# Patient Record
Sex: Male | Born: 1970 | Race: White | Hispanic: No | Smoking: Never smoker
Health system: Southern US, Community
[De-identification: ages and names within clinical notes are randomized; demographics above are authoritative.]

## PROBLEM LIST (undated history)

## (undated) DIAGNOSIS — E785 Hyperlipidemia, unspecified: Secondary | ICD-10-CM

## (undated) DIAGNOSIS — Z9889 Other specified postprocedural states: Secondary | ICD-10-CM

## (undated) DIAGNOSIS — T8859XA Other complications of anesthesia, initial encounter: Secondary | ICD-10-CM

## (undated) DIAGNOSIS — F419 Anxiety disorder, unspecified: Secondary | ICD-10-CM

## (undated) DIAGNOSIS — I499 Cardiac arrhythmia, unspecified: Secondary | ICD-10-CM

## (undated) DIAGNOSIS — K824 Cholesterolosis of gallbladder: Secondary | ICD-10-CM

## (undated) DIAGNOSIS — I10 Essential (primary) hypertension: Secondary | ICD-10-CM

## (undated) DIAGNOSIS — K219 Gastro-esophageal reflux disease without esophagitis: Secondary | ICD-10-CM

## (undated) DIAGNOSIS — Z87442 Personal history of urinary calculi: Secondary | ICD-10-CM

## (undated) DIAGNOSIS — G43109 Migraine with aura, not intractable, without status migrainosus: Secondary | ICD-10-CM

## (undated) DIAGNOSIS — F411 Generalized anxiety disorder: Secondary | ICD-10-CM

## (undated) DIAGNOSIS — N200 Calculus of kidney: Secondary | ICD-10-CM

## (undated) DIAGNOSIS — I2510 Atherosclerotic heart disease of native coronary artery without angina pectoris: Secondary | ICD-10-CM

## (undated) HISTORY — PX: ROTATOR CUFF REPAIR: SHX139

## (undated) HISTORY — DX: Cardiac arrhythmia, unspecified: I49.9

## (undated) HISTORY — DX: Cholesterolosis of gallbladder: K82.4

## (undated) HISTORY — DX: Migraine with aura, not intractable, without status migrainosus: G43.109

## (undated) HISTORY — DX: Gastro-esophageal reflux disease without esophagitis: K21.9

## (undated) HISTORY — DX: Generalized anxiety disorder: F41.1

## (undated) HISTORY — DX: Hyperlipidemia, unspecified: E78.5

## (undated) HISTORY — DX: Essential (primary) hypertension: I10

## (undated) HISTORY — DX: Calculus of kidney: N20.0

## (undated) HISTORY — PX: OTHER SURGICAL HISTORY: SHX169

## (undated) HISTORY — PX: TONSILLECTOMY: SUR1361

## (undated) HISTORY — PX: RETINAL DETACHMENT SURGERY: SHX105

## (undated) HISTORY — PX: INGUINAL HERNIA REPAIR: SUR1180

---

## 2007-03-01 ENCOUNTER — Encounter: Admission: RE | Admit: 2007-03-01 | Discharge: 2007-03-01 | Payer: Self-pay | Admitting: *Deleted

## 2007-04-23 ENCOUNTER — Ambulatory Visit (HOSPITAL_COMMUNITY): Admission: RE | Admit: 2007-04-23 | Discharge: 2007-04-23 | Payer: Self-pay | Admitting: *Deleted

## 2008-09-22 ENCOUNTER — Ambulatory Visit: Payer: Self-pay | Admitting: Family Medicine

## 2008-09-22 DIAGNOSIS — R519 Headache, unspecified: Secondary | ICD-10-CM | POA: Insufficient documentation

## 2008-09-22 DIAGNOSIS — R51 Headache: Secondary | ICD-10-CM

## 2008-09-22 DIAGNOSIS — F411 Generalized anxiety disorder: Secondary | ICD-10-CM | POA: Insufficient documentation

## 2008-09-23 ENCOUNTER — Encounter: Payer: Self-pay | Admitting: Family Medicine

## 2008-09-23 LAB — CONVERTED CEMR LAB: TSH: 0.533 microintl units/mL (ref 0.350–4.500)

## 2008-09-29 ENCOUNTER — Ambulatory Visit: Payer: Self-pay | Admitting: Family Medicine

## 2008-09-29 DIAGNOSIS — R002 Palpitations: Secondary | ICD-10-CM

## 2010-10-18 NOTE — Op Note (Signed)
[name], Carl Kelly                 ACCOUNT NO.:  [id number]   MEDICAL RECORD NO.:  [id number]          PATIENT TYPE:  AMB   LOCATION:  DAY                          FACILITY:  [facility]   PHYSICIAN:  Alfonse Ras, MD   DATE OF BIRTH:  1971/01/17   DATE OF PROCEDURE:  04/23/2007  DATE OF DISCHARGE:                               OPERATIVE REPORT   OPERATING SURGEON:  Alfonse Ras, MD   PREOPERATIVE DIAGNOSIS:  Left groin pain, left inguinal hernia.   POSTOPERATIVE DIAGNOSIS:  Left inguinal hernia.   PROCEDURE:  Left inguinal hernia repair with mesh.   ANESTHESIA:  Laryngeal mask, general.   DESCRIPTION OF PROCEDURE:  The patient was taken to the operating room  and placed in a supine position.  After adequate general anesthesia was  induced using the laryngeal mask, the left groin was prepped and draped  in the normal sterile fashion.  Using an oblique incision over the  inguinal canal, I dissected down onto the external oblique fascia.  This  was opened along its fibers down to the external ring.  Spermatic cord  was identified, dissected, and a Penrose drain was placed around it.  There was a little bit of herniation of preperitoneal fat along the  spermatic cord, which was easily dissected off.  The vas deferens and  vessels were preserved.  There was a very small direct hernia defect  which was repaired by using interrupted 0 Surgilon sutures approximating  the transversalis fascia to the Cooper's ligament and to the shelving  edge of the inguinal ligament.  A piece of onlay Bard PTFE mesh was  placed over the repair and tacked to the pubic tubercle using running 2-  0 Prolene suture along the transversalis fascia and along the inguinal  ligament.  This was split and brought out lateral to the internal ring.  The external oblique fascia was then closed with running 3-0 Vicryl  suture.  All tissues were injected with 0.5 Marcaine.  Skin was closed  with staples.  Sterile  dressing was applied, and the patient was taken  to PACU in good condition.      Alfonse Ras, MD  Electronically Signed     KRE/MEDQ  D:  04/23/2007  T:  04/24/2007  Job:  [id number]

## 2011-03-14 LAB — BASIC METABOLIC PANEL
BUN: 7
CO2: 35 — ABNORMAL HIGH
Chloride: 100
Glucose, Bld: 118 — ABNORMAL HIGH
Potassium: 3.9

## 2011-03-14 LAB — HEMOGLOBIN AND HEMATOCRIT, BLOOD: Hemoglobin: 15.4

## 2014-03-10 ENCOUNTER — Other Ambulatory Visit (HOSPITAL_COMMUNITY): Payer: Self-pay | Admitting: Internal Medicine

## 2014-03-10 DIAGNOSIS — R1011 Right upper quadrant pain: Secondary | ICD-10-CM

## 2014-03-11 ENCOUNTER — Ambulatory Visit (HOSPITAL_COMMUNITY)
Admission: RE | Admit: 2014-03-11 | Discharge: 2014-03-11 | Disposition: A | Discharge status: Home / Self Care | Payer: 59 | Source: Ambulatory Visit | Attending: Internal Medicine | Admitting: Internal Medicine

## 2014-03-11 DIAGNOSIS — R1011 Right upper quadrant pain: Secondary | ICD-10-CM | POA: Diagnosis present

## 2014-03-13 ENCOUNTER — Encounter: Payer: Self-pay | Admitting: Physician Assistant

## 2014-03-13 ENCOUNTER — Encounter: Payer: Self-pay | Admitting: Internal Medicine

## 2014-03-14 ENCOUNTER — Encounter (HOSPITAL_COMMUNITY): Payer: Self-pay | Admitting: Emergency Medicine

## 2014-03-14 ENCOUNTER — Emergency Department (HOSPITAL_COMMUNITY)
Admission: EM | Admit: 2014-03-14 | Discharge: 2014-03-14 | Disposition: A | Discharge status: Home / Self Care | Payer: 59 | Source: Home / Self Care | Attending: Emergency Medicine | Admitting: Emergency Medicine

## 2014-03-14 DIAGNOSIS — B002 Herpesviral gingivostomatitis and pharyngotonsillitis: Secondary | ICD-10-CM

## 2014-03-14 HISTORY — DX: Anxiety disorder, unspecified: F41.9

## 2014-03-14 MED ORDER — METHYLPREDNISOLONE SODIUM SUCC 125 MG IJ SOLR
80.0000 mg | Freq: Once | INTRAMUSCULAR | Status: AC
  Administered 2014-03-14: 80 mg via INTRAMUSCULAR

## 2014-03-14 MED ORDER — LIDOCAINE VISCOUS 2 % MT SOLN: 5.0000 mL | OROMUCOSAL | Status: DC | PRN

## 2014-03-14 MED ORDER — VALACYCLOVIR HCL 1 G PO TABS: 1000.0000 mg | ORAL_TABLET | Freq: Two times a day (BID) | ORAL | Status: AC

## 2014-03-14 MED ORDER — METHYLPREDNISOLONE SODIUM SUCC 125 MG IJ SOLR
INTRAMUSCULAR | Status: AC
  Filled 2014-03-14: qty 2

## 2014-03-14 NOTE — ED Notes (Signed)
C/o  Severe sore throat, nausea, and fever since Thursday.  [facility] and tested for strep and flu.  Test were negative.  Strep has been sent for culture.     Also has sores in mouth noticed shortly after taking erythromycin.  Pt has not taken a second dose today.

## 2014-03-14 NOTE — Discharge Instructions (Signed)
You have herpetic gingivostomatitis.   Take Valtrex 1 pill twice a day for 10 days. Use the lidocaine every 4 hours as needed for mouth pain. Take tylenol and ibuprofen as needed for fevers. You should see some improvement in 24-48 hours.  Primary Herpetic Gingivostomatitis  Primary herpetic gingivostomatitis is an infection of the mouth, gums, and throat. It is a common infection in children, teenagers, and young adults. CAUSES  Primary herpetic gingivostomatitis is caused by a virus called herpes simplex type 1 (HSV). This is the same virus that causes cold sores. This virus is carried by many people. Most people get this infection early in childhood. Once infected, people carry the virus forever. It may flare up as cold sores repeatedly. The first infection of this virus may go unnoticed. When it causes symptoms of sore mouth and gums, it is called gingivostomatitis. SYMPTOMS  The symptoms of this infection can be mild or severe. Symptoms may last for 1 to 2 weeks and may include:  Small sores and blisters in the mouth, tongue, gums, throat, and on the lips.  Swelling of the gums.  Severe mouth pain.  Bleeding gums.  Irritability from pain.  Decreased appetite or refusal to eat or drink.  Drooling.  Bad breath.  High fever.  Swollen tender lymph nodes on the sides of the neck.  Headache.  General discomfort, uneasiness, or ill feeling. DIAGNOSIS  Diagnosis of gingivostomatitis is usually made by a physical exam. Sometimes the sores are tested for the HSV virus. TREATMENT  This infection goes away on its own. Sometimes, a medicine to treat the herpes virus is used to help shorten the illness. Medicated mouth rinses can help with mouth pain. HOME CARE INSTRUCTIONS  Only take over-the-counter or prescription medicines for pain, discomfort, or fever as directed by your caregiver.  Keep the mouth and teeth clean. Use gentle brushing. If brushing is too painful, wipe the  teeth with a wet washcloth. Bleeding of the gums may occur.  Infants should continue with breast milk or formula as normal.  Offer soft and cold foods to toddlers and children. Ice cream, gelatin dessert, and yogurt work well.  Offer plenty of liquids to prevent dehydration. Frozen ice pops and cool, non-citrus juices may be soothing.  Keep your child away from others, especially infants and patients on cancer medicines.  Wash your hands well after handling children that are infected.  Infected children should keep their hands away from their mouth. They should avoid rubbing their eyes, and they should wash their hands often. SEEK MEDICAL CARE IF:   Your child is refusing to drink or take fluids.  Your child's fever comes back after being gone for 1 or 2 days.  Your child's pain is severe and is not controlled with medicines.  Your child's condition is getting worse. SEEK IMMEDIATE MEDICAL CARE IF:   Your child has pain and redness in the eye.  Your child has decreased or blurred vision.  Your child has eye pain or increased sensitivity to light.  Your child has tearing or fluid draining from the eye.  Your child has signs of dehydration such as unusual fussiness, weakness, fatigue, dry mouth, no tears when crying, or not urinating at least once every 8 hours. MAKE SURE YOU:  Understand these instructions.  Will watch your child's condition.  Will get help right away if your child is not doing well or gets worse. Document Released: 08/29/2007 Document Revised: 08/14/2011 Document Reviewed: 12/12/2010 [employer] Patient Information  2015 [employer]. This information is not intended to replace advice given to you by your health care provider. Make sure you discuss any questions you have with your health care provider. ° °

## 2014-03-14 NOTE — ED Provider Notes (Addendum)
CSN: [id number]     Arrival date & time 03/14/14  1221 History   First MD Initiated Contact with Patient 03/14/14 1341     Chief Complaint  Patient presents with  . Sore Throat  . Fever   (Consider location/radiation/quality/duration/timing/severity/associated sxs/prior Treatment) HPI He is a 43 year old man here for evaluation of sore throat and fever. His symptoms started on Thursday with a sore throat. On Friday, the sore throat got worse and he developed fevers to 102, body aches, ulcers in his mouth. He was seen at an urgent care and had a negative strep and flu. He took some Tylenol this morning, which helps the fever. He is unable to keep food do to the pain in his throat. It is difficult to drink liquids. He denies any cough or shortness of breath. Denies any nausea or vomiting.  Past Medical History  Diagnosis Date  . Anxiety    Past Surgical History  Procedure Laterality Date  . Hernia repair    . Tonsillectomy    . Rotator cuff repair    . Eye surgery     History reviewed. No pertinent family history. History  Substance Use Topics  . Smoking status: Never Smoker   . Smokeless tobacco: Not on file  . Alcohol Use: Yes    Review of Systems  Constitutional: Positive for fever, appetite change and fatigue.  HENT: Positive for sore throat and trouble swallowing. Negative for congestion and rhinorrhea.   Respiratory: Negative.   Gastrointestinal: Negative.   Genitourinary: Negative for decreased urine volume.  Musculoskeletal: Positive for myalgias.  Skin: Positive for rash (ulcers in mouth).  Neurological: Negative.     Allergies  Ampicillin and Meperidine hcl  Home Medications   Prior to Admission medications   Medication Sig Start Date End Date Taking? Authorizing Provider  clonazePAM (KLONOPIN) 0.5 MG tablet Take 0.5 mg by mouth 2 (two) times daily as needed for anxiety.   Yes Historical Provider, MD  DULoxetine (CYMBALTA) 60 MG capsule Take 60 mg by mouth  daily.   Yes Historical Provider, MD  ezetimibe (ZETIA) 10 MG tablet Take 10 mg by mouth daily.   Yes Historical Provider, MD  metoprolol tartrate (LOPRESSOR) 25 MG tablet Take 25 mg by mouth 2 (two) times daily.   Yes Historical Provider, MD  omeprazole (PRILOSEC) 40 MG capsule Take 40 mg by mouth daily.   Yes Historical Provider, MD  lidocaine (XYLOCAINE) 2 % solution Use as directed 5 mLs in the mouth or throat every 4 (four) hours as needed for mouth pain. 03/14/14   Charm RingsErin J Oswald Pott, MD  valACYclovir (VALTREX) 1000 MG tablet Take 1 tablet (1,000 mg total) by mouth 2 (two) times daily. 03/14/14 03/28/14  Charm RingsErin J Arilla Hice, MD   BP 133/81  Pulse 101  Temp(Src) 99.5 F (37.5 C) (Oral)  Resp 16  SpO2 100% Physical Exam  Constitutional: He is oriented to person, place, and time. He appears well-developed and well-nourished. No distress.  HENT:  Head: Normocephalic and atraumatic.  Mouth/Throat: Posterior oropharyngeal edema and posterior oropharyngeal erythema present. No oropharyngeal exudate.  Multiple aphthous ulcers on palate, buccal mucosa, and interior lips.  Neck: Neck supple.  Cardiovascular: Normal rate, regular rhythm and normal heart sounds.   No murmur heard. Pulmonary/Chest: Effort normal and breath sounds normal. No respiratory distress. He has no wheezes. He has no rales.  Lymphadenopathy:    He has cervical adenopathy.  Neurological: He is alert and oriented to person, place, and time.  Skin: Skin is warm and dry. No rash noted.    ED Course  Procedures (including critical care time) Labs Review Labs Reviewed - No data to display  Imaging Review No results found.   MDM   1. Herpetic gingivostomatitis    History and exam consistent with primary episode HSV. Had extensive discussion with patient and his girlfriend regarding etiology and sequela. Will treat with Valtrex 1 g twice a day x10 days. Viscous lidocaine prescription provided for symptomatic care. Emphasized  importance of fluid intake. Followup if no improvement by middle of next week. Warning signs reviewed as in after visit summary.  Solumedrol 80mg  IM given.  Charm RingsErin J Valeria Boza, MD 03/14/14 1423  Charm RingsErin J Arturo Freundlich, MD 06/26/14 1300

## 2014-03-17 ENCOUNTER — Encounter (HOSPITAL_COMMUNITY): Payer: Self-pay | Admitting: Emergency Medicine

## 2014-03-17 ENCOUNTER — Emergency Department (HOSPITAL_COMMUNITY)
Admission: EM | Admit: 2014-03-17 | Discharge: 2014-03-17 | Disposition: A | Discharge status: Home / Self Care | Payer: 59 | Attending: Emergency Medicine | Admitting: Emergency Medicine

## 2014-03-17 DIAGNOSIS — F419 Anxiety disorder, unspecified: Secondary | ICD-10-CM | POA: Insufficient documentation

## 2014-03-17 DIAGNOSIS — Z79899 Other long term (current) drug therapy: Secondary | ICD-10-CM | POA: Diagnosis not present

## 2014-03-17 DIAGNOSIS — B002 Herpesviral gingivostomatitis and pharyngotonsillitis: Secondary | ICD-10-CM | POA: Diagnosis not present

## 2014-03-17 DIAGNOSIS — J029 Acute pharyngitis, unspecified: Secondary | ICD-10-CM | POA: Diagnosis present

## 2014-03-17 MED ORDER — HYDROCODONE-ACETAMINOPHEN 7.5-325 MG/15ML PO SOLN
10.0000 mL | Freq: Once | ORAL | Status: AC
  Administered 2014-03-17: 10 mL via ORAL
  Filled 2014-03-17: qty 15

## 2014-03-17 MED ORDER — HYDROCODONE-ACETAMINOPHEN 7.5-325 MG/15ML PO SOLN: 15.0000 mL | ORAL | Status: DC | PRN

## 2014-03-17 MED ORDER — MAGIC MOUTHWASH: 5.0000 mL | Freq: Four times a day (QID) | ORAL | Status: DC | PRN

## 2014-03-17 MED ORDER — ONDANSETRON 4 MG PO TBDP
4.0000 mg | ORAL_TABLET | Freq: Once | ORAL | Status: AC
  Administered 2014-03-17: 4 mg via ORAL
  Filled 2014-03-17: qty 1

## 2014-03-17 MED ORDER — MAGIC MOUTHWASH
10.0000 mL | Freq: Once | ORAL | Status: AC
  Administered 2014-03-17: 10 mL via ORAL
  Filled 2014-03-17: qty 10

## 2014-03-17 NOTE — ED Notes (Signed)
Per EMS: Patient reports he was seen at Urgent Care on Saturday and was Dx with "Herpetic gingivostomatitis", reports he has been taking the prescribed Valtrex and Vicous Lidocaine without relief. Ax4, NAD. Airway intact.

## 2014-03-17 NOTE — ED Provider Notes (Signed)
CSN: [id number]     Arrival date & time 03/17/14  0059 History   First MD Initiated Contact with Patient 03/17/14 0401     Chief Complaint  Patient presents with  . Sore Throat     (Consider location/radiation/quality/duration/timing/severity/associated sxs/prior Treatment) HPI 43 year old male presents to emergency room with complaint of oral ulcers.  Patient seen on the 10th and diagnosed with herpetic gingiva stomatitis.  He has been taking Valtrex and this is limiting but has not had significant pain relief.  He is able to handle his own discretion.  He has had no further fevers.  This is his first outbreak Past Medical History  Diagnosis Date  . Anxiety    Past Surgical History  Procedure Laterality Date  . Hernia repair    . Tonsillectomy    . Rotator cuff repair    . Eye surgery     No family history on file. History  Substance Use Topics  . Smoking status: Never Smoker   . Smokeless tobacco: Not on file  . Alcohol Use: Yes    Review of Systems  See History of Present Illness; otherwise all other systems are reviewed and negative   Allergies  Meperidine hcl and Ampicillin  Home Medications   Prior to Admission medications   Medication Sig Start Date End Date Taking? Authorizing Provider  clonazePAM (KLONOPIN) 0.5 MG tablet Take 0.5 mg by mouth 2 (two) times daily as needed for anxiety.   Yes Historical Provider, MD  DULoxetine (CYMBALTA) 60 MG capsule Take 60 mg by mouth daily.   Yes Historical Provider, MD  ezetimibe (ZETIA) 10 MG tablet Take 10 mg by mouth daily.   Yes Historical Provider, MD  lidocaine (XYLOCAINE) 2 % solution Use as directed 5 mLs in the mouth or throat every 4 (four) hours as needed for mouth pain. 03/14/14  Yes Charm RingsErin J Honig, MD  metoprolol-hydrochlorothiazide (LOPRESSOR HCT) 50-25 MG per tablet Take 1 tablet by mouth daily.   Yes Historical Provider, MD  Multiple Vitamin (MULTIVITAMIN WITH MINERALS) TABS tablet Take 1 tablet by mouth  daily.   Yes Historical Provider, MD  niacin 100 MG tablet Take 100 mg by mouth daily.   Yes Historical Provider, MD  omega-3 acid ethyl esters (LOVAZA) 1 G capsule Take 1 g by mouth daily.   Yes Historical Provider, MD  omeprazole (PRILOSEC) 40 MG capsule Take 40 mg by mouth daily.   Yes Historical Provider, MD  valACYclovir (VALTREX) 1000 MG tablet Take 1 tablet (1,000 mg total) by mouth 2 (two) times daily. 03/14/14 03/28/14 Yes Charm RingsErin J Honig, MD   BP 114/68  Pulse 65  Temp(Src) 97.8 F (36.6 C) (Oral)  Resp 14  Wt 183 lb 7 oz (83.207 kg)  SpO2 98% Physical Exam  Nursing note and vitals reviewed. Constitutional: He is oriented to person, place, and time. He appears well-developed and well-nourished.  HENT:  Head: Normocephalic and atraumatic.  Nose: Nose normal.  Patient has several shallow ulcers with yellow base, noted over the lips and posterior pharynx  Eyes: Conjunctivae and EOM are normal. Pupils are equal, round, and reactive to light.  Neck: Normal range of motion. Neck supple. No JVD present. No tracheal deviation present. No thyromegaly present.  Cardiovascular: Normal rate, regular rhythm, normal heart sounds and intact distal pulses.  Exam reveals no gallop and no friction rub.   No murmur heard. Pulmonary/Chest: Effort normal and breath sounds normal. No stridor. No respiratory distress. He has no wheezes. He  has no rales. He exhibits no tenderness.  Abdominal: Soft. Bowel sounds are normal. He exhibits no distension and no mass. There is no tenderness. There is no rebound and no guarding.  Musculoskeletal: Normal range of motion. He exhibits no edema and no tenderness.  Lymphadenopathy:    He has no cervical adenopathy.  Neurological: He is alert and oriented to person, place, and time. He displays normal reflexes. He exhibits normal muscle tone. Coordination normal.  Skin: Skin is warm and dry. No rash noted. No erythema. No pallor.  Psychiatric: He has a normal mood  and affect. His behavior is normal. Judgment and thought content normal.    ED Course  Procedures (including critical care time) Labs Review Labs Reviewed - No data to display  Imaging Review No results found.   EKG Interpretation None      MDM   Final diagnoses:  Herpetic gingivostomatitis   We'll plan to treat with Magic mouthwash and Lortab elixir.   Olivia Mackielga M Layman Gully, MD 03/17/14 [id number]

## 2014-03-17 NOTE — Discharge Instructions (Signed)
Primary Herpetic Gingivostomatitis   Primary herpetic gingivostomatitis is an infection of the mouth, gums, and throat. It is a common infection in children, teenagers, and young adults.  CAUSES   Primary herpetic gingivostomatitis is caused by a virus called herpes simplex type 1 (HSV). This is the same virus that causes cold sores. This virus is carried by many people. Most people get this infection early in childhood. Once infected, people carry the virus forever. It may flare up as cold sores repeatedly. The first infection of this virus may go unnoticed. When it causes symptoms of sore mouth and gums, it is called gingivostomatitis.  SYMPTOMS   The symptoms of this infection can be mild or severe. Symptoms may last for 1 to 2 weeks and may include:   Small sores and blisters in the mouth, tongue, gums, throat, and on the lips.   Swelling of the gums.   Severe mouth pain.   Bleeding gums.   Irritability from pain.   Decreased appetite or refusal to eat or drink.   Drooling.   Bad breath.   High fever.   Swollen tender lymph nodes on the sides of the neck.   Headache.   General discomfort, uneasiness, or ill feeling.  DIAGNOSIS   Diagnosis of gingivostomatitis is usually made by a physical exam. Sometimes the sores are tested for the HSV virus.  TREATMENT   This infection goes away on its own. Sometimes, a medicine to treat the herpes virus is used to help shorten the illness. Medicated mouth rinses can help with mouth pain.  HOME CARE INSTRUCTIONS   Only take over-the-counter or prescription medicines for pain, discomfort, or fever as directed by your caregiver.   Keep the mouth and teeth clean. Use gentle brushing. If brushing is too painful, wipe the teeth with a wet washcloth. Bleeding of the gums may occur.   Infants should continue with breast milk or formula as normal.   Offer soft and cold foods to toddlers and children. Ice cream, gelatin dessert, and yogurt work well.   Offer plenty of  liquids to prevent dehydration. Frozen ice pops and cool, non-citrus juices may be soothing.   Keep your child away from others, especially infants and patients on cancer medicines.   Wash your hands well after handling children that are infected.   Infected children should keep their hands away from their mouth. They should avoid rubbing their eyes, and they should wash their hands often.  SEEK MEDICAL CARE IF:    Your child is refusing to drink or take fluids.   Your child's fever comes back after being gone for 1 or 2 days.   Your child's pain is severe and is not controlled with medicines.   Your child's condition is getting worse.  SEEK IMMEDIATE MEDICAL CARE IF:    Your child has pain and redness in the eye.   Your child has decreased or blurred vision.   Your child has eye pain or increased sensitivity to light.   Your child has tearing or fluid draining from the eye.   Your child has signs of dehydration such as unusual fussiness, weakness, fatigue, dry mouth, no tears when crying, or not urinating at least once every 8 hours.  MAKE SURE YOU:   Understand these instructions.   Will watch your child's condition.   Will get help right away if your child is not doing well or gets worse.  Document Released: 08/29/2007 Document Revised: 08/14/2011 Document Reviewed: 12/12/2010  

## 2014-03-24 ENCOUNTER — Ambulatory Visit: Payer: 59 | Admitting: Physician Assistant

## 2014-03-26 ENCOUNTER — Encounter: Payer: Self-pay | Admitting: *Deleted

## 2014-04-02 ENCOUNTER — Ambulatory Visit: Payer: 59 | Admitting: Gastroenterology

## 2014-04-02 ENCOUNTER — Telehealth: Payer: Self-pay | Admitting: *Deleted

## 2014-04-02 NOTE — Telephone Encounter (Signed)
[facility] Sent letter

## 2014-04-23 ENCOUNTER — Encounter: Payer: Self-pay | Admitting: Gastroenterology

## 2014-04-23 ENCOUNTER — Ambulatory Visit (INDEPENDENT_AMBULATORY_CARE_PROVIDER_SITE_OTHER): Payer: 59 | Admitting: Gastroenterology

## 2014-04-23 VITALS — BP 100/60 | HR 66 | Ht 70.5 in | Wt 185.0 lb

## 2014-04-23 DIAGNOSIS — R1011 Right upper quadrant pain: Secondary | ICD-10-CM | POA: Insufficient documentation

## 2014-04-23 MED ORDER — OMEPRAZOLE 40 MG PO CPDR: 40.0000 mg | DELAYED_RELEASE_CAPSULE | Freq: Two times a day (BID) | ORAL | Status: DC

## 2014-04-23 NOTE — Progress Notes (Signed)
Reviewed and agree with management. Lindalou Soltis D. Khiana Camino, M.D., FACG  

## 2014-04-23 NOTE — Progress Notes (Signed)
04/23/2014 Carl ButteBrian T Kelly [id number] 11-12-1970   HISTORY OF PRESENT ILLNESS:  This is a 43 year old male who is new to our practice and presents to the office at the referral of his PCP, Dr. Renne CriglerPharr, for evaluation of RUQ abdominal pain.  He says that the pain has been present for a couple of years on and off but recently has become constant pain that is worsened with eating.  Says about 15 minutes after eating it feels like someone punched him in his side.  Says that it is described as a "burning ache" and radiates around to the right side of his back/right shoulder blade at times.  Sometimes gets nausea if it is very severe but no vomiting.  Says that he's had reflux for a long time and has been on omeprazole 40 mg daily for about 5 years or so, which controls those symptoms very well.  Takes some NSAID's for aches and pains a couple of times per day on a couple of days out of each week.  Ultrasound of the abdomen in October showed only a 4 mm gallbladder polyp.  Labs including CBC, CMP, TSH, and amylase/lipase were all WNL's.   Past Medical History  Diagnosis Date  . Anxiety   . Gallbladder polyp   . Hyperlipidemia   . GERD (gastroesophageal reflux disease)   . GAD (generalized anxiety disorder)   . Hypertension   . Kidney stones   . Arrhythmia     heart   Past Surgical History  Procedure Laterality Date  . Inguinal hernia repair Left   . Tonsillectomy    . Rotator cuff repair Right   . Retinal detachment surgery Bilateral   . Thumb surgery Right     reports that he has never smoked. He has never used smokeless tobacco. He reports that he drinks alcohol. He reports that he does not use illicit drugs. family history includes Diabetes in his paternal grandmother; Heart disease in his father; Stomach cancer in his paternal grandfather. There is no history of Colon cancer. Allergies  Allergen Reactions  . Meperidine Hcl Anaphylaxis  . Ampicillin Rash      Outpatient Encounter  Prescriptions as of 04/23/2014  Medication Sig  . Alum & Mag Hydroxide-Simeth (MAGIC MOUTHWASH) SOLN Take 5 mLs by mouth every 6 (six) hours as needed (mouth pain). 1:1 mix of benadryl 12.5 mg/5 ml and Alum &Mag Hydroxide-simethicone.  Give 5 ml q 6 hours prn mouth pain  . clonazePAM (KLONOPIN) 0.5 MG tablet Take 0.5 mg by mouth 2 (two) times daily as needed for anxiety.  . DULoxetine (CYMBALTA) 60 MG capsule Take 60 mg by mouth daily.  Marland Kitchen. ezetimibe (ZETIA) 10 MG tablet Take 10 mg by mouth daily.  Marland Kitchen. HYDROcodone-acetaminophen (HYCET) 7.5-325 mg/15 ml solution Take 15 mLs by mouth every 4 (four) hours as needed for moderate pain or severe pain.  Marland Kitchen. lidocaine (XYLOCAINE) 2 % solution Use as directed 5 mLs in the mouth or throat every 4 (four) hours as needed for mouth pain.  . Metoprolol Tartrate (LOPRESSOR PO) Take by mouth. 50-12.5mg   Take one tablet by mouth daily.  . Multiple Vitamin (MULTIVITAMIN WITH MINERALS) TABS tablet Take 1 tablet by mouth daily.  . niacin 100 MG tablet Take 100 mg by mouth daily.  Marland Kitchen. omega-3 acid ethyl esters (LOVAZA) 1 G capsule Take 1 g by mouth daily.  Marland Kitchen. omeprazole (PRILOSEC) 40 MG capsule Take 1 capsule (40 mg total) by mouth 2 (two)  times daily.  . [DISCONTINUED] omeprazole (PRILOSEC) 40 MG capsule Take 40 mg by mouth daily.  . [DISCONTINUED] metoprolol-hydrochlorothiazide (LOPRESSOR HCT) 50-25 MG per tablet Take 1 tablet by mouth daily.     REVIEW OF SYSTEMS  : All other systems reviewed and negative except where noted in the History of Present Illness.   PHYSICAL EXAM: BP 100/60 mmHg  Pulse 66  Ht 5' 10.5" (1.791 m)  Wt 185 lb (83.915 kg)  BMI 26.16 kg/m2 General: Well developed white male in no acute distress Head: Normocephalic and atraumatic Eyes:  Sclerae anicteric, conjunctiva pink. Ears: Normal auditory acuity Lungs: Clear throughout to auscultation Heart: Regular rate and rhythm Abdomen: Soft, non-distended.  Normal bowel sounds.  RUQ TTP along  the costal margin. Musculoskeletal: Symmetrical with no gross deformities  Skin: No lesions on visible extremities Extremities: No edema  Neurological: Alert oriented x 4, grossly non-focal Psychological:  Alert and cooperative. Normal mood and affect  ASSESSMENT AND PLAN: -RUQ abdominal pain:  Intermittent for past 2 years or so, but now more constant/frequent.  Ultrasound showed only 4 mm gallbladder polyp.  Differential includes chronic cholecystitis/dysfunctional gallbladder vs GERD/esophagitis/ulcer disease, etc. -Chronic GERD:  Symptoms well controlled on omeprazole 40 mg daily.  *Will check HIDA scan with [facility].  If negative then may need to consider EGD. *Will increase omeprazole 40 mg to BID for now.

## 2014-04-23 NOTE — Patient Instructions (Signed)
Please increase Omeprazole 40 mg to twice daily, new prescription was sent to your pharmacy   ___________________________________________________________________________________________________________________________________________________________________________________________ [facility]. Please arrive 15 minutes prior to your scheduled appointment at  7:30 AM. Make certain not to have anything to eat or drink at least 6 hours prior to your test. Should this appointment date or time not work well for you, please call radiology scheduling at [phone].  _____________________________________________________________________ hepatobiliary (HIDA) scan is an imaging procedure used to diagnose problems in the liver, gallbladder and bile ducts. In the HIDA scan, a radioactive chemical or tracer is injected into a vein in your arm. The tracer is handled by the liver like bile. Bile is a fluid produced and excreted by your liver that helps your digestive system break down fats in the foods you eat. Bile is stored in your gallbladder and the gallbladder releases the bile when you eat a meal. A special nuclear medicine scanner (gamma camera) tracks the flow of the tracer from your liver into your gallbladder and small intestine.  During your HIDA scan  You'll be asked to change into a hospital gown before your HIDA scan begins. Your health care team will position you on a table, usually on your back. The radioactive tracer is then injected into a vein in your arm.The tracer travels through your bloodstream to your liver, where it's taken up by the bile-producing cells. The radioactive tracer travels with the bile from your liver into your gallbladder and through your bile ducts to your small intestine.You may feel some pressure while the radioactive tracer is injected into your vein. As you lie on the table, a special gamma camera is  positioned over your abdomen taking pictures of the tracer as it moves through your body. The gamma camera takes pictures continually for about an hour. You'll need to keep still during the HIDA scan. This can become uncomfortable, but you may find that you can lessen the discomfort by taking deep breaths and thinking about other things. Tell your health care team if you're uncomfortable. The radiologist will watch on a computer the progress of the radioactive tracer through your body. The HIDA scan may be stopped when the radioactive tracer is seen in the gallbladder and enters your small intestine. This typically takes about an hour. In some cases extra imaging will be performed if original images aren't satisfactory, if morphine is given to help visualize the gallbladder or if the medication CCK is given to look at the contraction of the gallbladder. This test typically takes 2 hours to complete. ________________________________________________________________________

## 2014-05-08 ENCOUNTER — Ambulatory Visit (HOSPITAL_COMMUNITY)
Admission: RE | Admit: 2014-05-08 | Discharge: 2014-05-08 | Disposition: A | Discharge status: Home / Self Care | Payer: 59 | Source: Ambulatory Visit | Attending: Gastroenterology | Admitting: Gastroenterology

## 2014-05-08 DIAGNOSIS — R109 Unspecified abdominal pain: Secondary | ICD-10-CM | POA: Diagnosis not present

## 2014-05-08 DIAGNOSIS — R110 Nausea: Secondary | ICD-10-CM | POA: Insufficient documentation

## 2014-05-08 DIAGNOSIS — R1011 Right upper quadrant pain: Secondary | ICD-10-CM

## 2014-05-08 MED ORDER — TECHNETIUM TC 99M MEBROFENIN IV KIT
5.5000 | PACK | Freq: Once | INTRAVENOUS | Status: AC | PRN
  Administered 2014-05-08: 6 via INTRAVENOUS

## 2014-05-13 ENCOUNTER — Encounter: Payer: Self-pay | Admitting: *Deleted

## 2014-05-14 ENCOUNTER — Telehealth: Payer: Self-pay | Admitting: Gastroenterology

## 2014-05-14 NOTE — Telephone Encounter (Signed)
Left a message for patient to call back. 

## 2014-05-15 NOTE — Telephone Encounter (Signed)
Left a message for patient to call back. 

## 2014-05-18 NOTE — Telephone Encounter (Signed)
Left a message for patient to call back. 

## 2014-05-19 NOTE — Telephone Encounter (Signed)
Unable to reach patient. Results have been mailed to patient.

## 2015-03-28 IMAGING — NM NM HEPATO W/GB/PHARM/[PERSON_NAME]
2 series · 12 of 12 positions shown · non-contrast
Comparison: None.

CLINICAL DATA: Abdominal pain and nausea for 3-4 months

EXAM:
NUCLEAR MEDICINE HEPATOBILIARY IMAGING WITH GALLBLADDER EF
TECHNIQUE: Sequential images of the abdomen were obtained [DATE] minutes
following intravenous administration of radiopharmaceutical. After
slow intravenous infusion of 1.7 micrograms Cholecystokinin,
gallbladder ejection fraction was determined.
RADIOPHARMACEUTICALS:  5.5 Millicurie Jc-IIm Choletec

[Series 1: he hepato · 4.75mm/px · 6 of 60 frames shown (1 of 2)]
[frame 6/60]
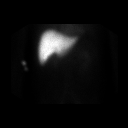
[frame 16/60]
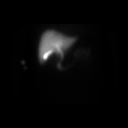
[frame 26/60]
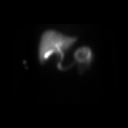
[frame 36/60]
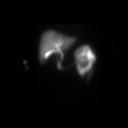
[frame 46/60]
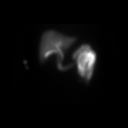
[frame 56/60]
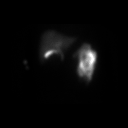

[Series 1: he hepato · 4.75mm/px · 6 of 30 frames shown (2 of 2)]
[frame 3/30]
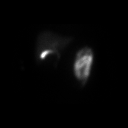
[frame 8/30]
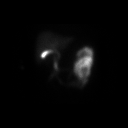
[frame 13/30]
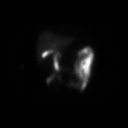
[frame 18/30]
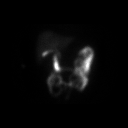
[frame 23/30]
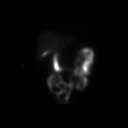
[frame 28/30]
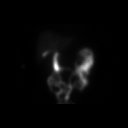

[12 of 12 positions shown; findings below may reference images not displayed]

FINDINGS: There is adequate uptake of the radiopharmaceutical by the liver.
The gallbladder is visible by 10 min and the common bile duct by 15
min. Bowel activity is evident by 20 min.. The 30 min gallbladder
ejection fraction is normal at 82%. At 30 min, normal ejection
fraction is expected to be greater than 30%.

The patient did not experience symptoms during CCK infusion.
IMPRESSION: Normal hepatobiliary scan and normal gallbladder ejection fraction.

## 2015-08-18 ENCOUNTER — Ambulatory Visit: Payer: Self-pay | Admitting: Physician Assistant

## 2015-09-24 ENCOUNTER — Ambulatory Visit: Payer: Self-pay | Admitting: Gastroenterology

## 2020-08-09 ENCOUNTER — Other Ambulatory Visit: Payer: Self-pay | Admitting: Internal Medicine

## 2020-08-09 DIAGNOSIS — G43109 Migraine with aura, not intractable, without status migrainosus: Secondary | ICD-10-CM

## 2021-12-01 ENCOUNTER — Encounter: Payer: Self-pay | Admitting: *Deleted

## 2021-12-05 ENCOUNTER — Ambulatory Visit: Payer: 59 | Admitting: Psychiatry

## 2021-12-28 ENCOUNTER — Ambulatory Visit: Payer: 59 | Admitting: Neurology

## 2021-12-28 ENCOUNTER — Encounter: Payer: Self-pay | Admitting: Neurology

## 2021-12-28 VITALS — BP 138/90 | HR 92 | Ht 71.0 in | Wt 197.4 lb

## 2021-12-28 DIAGNOSIS — G43109 Migraine with aura, not intractable, without status migrainosus: Secondary | ICD-10-CM | POA: Diagnosis not present

## 2021-12-28 DIAGNOSIS — G43709 Chronic migraine without aura, not intractable, without status migrainosus: Secondary | ICD-10-CM

## 2021-12-28 MED ORDER — RIZATRIPTAN BENZOATE 10 MG PO TBDP: 10.0000 mg | ORAL_TABLET | ORAL | 11 refills | Status: DC | PRN

## 2021-12-28 MED ORDER — EMGALITY 120 MG/ML ~~LOC~~ SOAJ: 120.0000 mg | SUBCUTANEOUS | 11 refills | Status: DC

## 2021-12-28 NOTE — Patient Instructions (Signed)
- Start Emgality prevention once monthly - did great on it, will try to get approved - stop sumatriptan - Acutely: Rizatriptan and Ondansetron. Take RIGHT at onset, repeat in 2 hours if needed. Max twice a day.  Rizatriptan Disintegrating Tablets What is this medication? RIZATRIPTAN (rye za TRIP tan) treats migraines. It works by blocking pain signals and narrowing blood vessels in the brain. It belongs to a group of medications called triptans. It is not used to prevent migraines. This medicine may be used for other purposes; ask your health care provider or pharmacist if you have questions. COMMON BRAND NAME(S): Maxalt-MLT What should I tell my care team before I take this medication? They need to know if you have any of these conditions: Cigarette smoker Circulation problems in fingers and toes Diabetes Heart disease High blood pressure High cholesterol History of irregular heartbeat History of stroke Kidney disease Liver disease Stomach or intestine problems An unusual or allergic reaction to rizatriptan, other medications, foods, dyes, or preservatives Pregnant or trying to get pregnant Breast-feeding How should I use this medication? Take this medication by mouth. Follow the directions on the prescription label. Leave the tablet in the sealed blister pack until you are ready to take it. With dry hands, open the blister and gently remove the tablet. If the tablet breaks or crumbles, throw it away and take a new tablet out of the blister pack. Place the tablet in the mouth and allow it to dissolve, and then swallow. Do not cut, crush, or chew this medication. You do not need water to take this medication. Do not take it more often than directed. Talk to your care team regarding the use of this medication in children. While this medication may be prescribed for children as young as 6 years for selected conditions, precautions do apply. Overdosage: If you think you have taken too much  of this medicine contact a poison control center or emergency room at once. NOTE: This medicine is only for you. Do not share this medicine with others. What if I miss a dose? This does not apply. This medication is not for regular use. What may interact with this medication? Do not take this medication with any of the following medications: Certain medications for migraine headache like almotriptan, eletriptan, frovatriptan, naratriptan, rizatriptan, sumatriptan, zolmitriptan Ergot alkaloids like dihydroergotamine, ergonovine, ergotamine, methylergonovine MAOIs like Carbex, Eldepryl, Marplan, Nardil, and Parnate This medication may also interact with the following medications: Certain medications for depression, anxiety, or psychotic disorders Propranolol This list may not describe all possible interactions. Give your health care provider a list of all the medicines, herbs, non-prescription drugs, or dietary supplements you use. Also tell them if you smoke, drink alcohol, or use illegal drugs. Some items may interact with your medicine. What should I watch for while using this medication? Visit your care team for regular checks on your progress. Tell your care team if your symptoms do not start to get better or if they get worse. You may get drowsy or dizzy. Do not drive, use machinery, or do anything that needs mental alertness until you know how this medication affects you. Do not stand up or sit up quickly, especially if you are an older patient. This reduces the risk of dizzy or fainting spells. Alcohol may interfere with the effect of this medication. Your mouth may get dry. Chewing sugarless gum or sucking hard candy and drinking plenty of water may help. Contact your care team if the problem does not  go away or is severe. If you take migraine medications for 10 or more days a month, your migraines may get worse. Keep a diary of headache days and medication use. Contact your care team if your  migraine attacks occur more frequently. What side effects may I notice from receiving this medication? Side effects that you should report to your care team as soon as possible: Allergic reactions--skin rash, itching, hives, swelling of the face, lips, tongue, or throat Burning, pain, tingling, or color changes in the legs or feet Heart attack--pain or tightness in the chest, shoulders, arms, or jaw, nausea, shortness of breath, cold or clammy skin, feeling faint or lightheaded Heart rhythm changes--fast or irregular heartbeat, dizziness, feeling faint or lightheaded, chest pain, trouble breathing Increase in blood pressure Irritability, confusion, fast or irregular heartbeat, muscle stiffness, twitching muscles, sweating, high fever, seizure, chills, vomiting, diarrhea, which may be signs of serotonin syndrome Raynaud's--cool, numb, or painful fingers or toes that may change color from pale, to blue, to red Seizures Stroke--sudden numbness or weakness of the face, arm, or leg, trouble speaking, confusion, trouble walking, loss of balance or coordination, dizziness, severe headache, change in vision Sudden or severe stomach pain, nausea, vomiting, fever, or bloody diarrhea Vision loss Side effects that usually do not require medical attention (report to your care team if they continue or are bothersome): Dizziness General discomfort or fatigue This list may not describe all possible side effects. Call your doctor for medical advice about side effects. You may report side effects to FDA at 1-800-FDA-1088. Where should I keep my medication? Keep out of the reach of children and pets. Store at room temperature between 15 and 30 degrees C (59 and 86 degrees F). Protect from light and moisture. Throw away any unused medication after the expiration date. NOTE: This sheet is a summary. It may not cover all possible information. If you have questions about this medicine, talk to your doctor, pharmacist,  or health care provider.  2023 Elsevier/Gold Standard (2020-06-30 00:00:00) Galcanezumab Injection What is this medication? GALCANEZUMAB (gal ka NEZ ue mab) prevents migraines. It works by blocking a substance in the body that causes migraines. It may also be used to treat cluster headaches. It is a monoclonal antibody. This medicine may be used for other purposes; ask your health care provider or pharmacist if you have questions. COMMON BRAND NAME(S): Emgality What should I tell my care team before I take this medication? They need to know if you have any of these conditions: An unusual or allergic reaction to galcanezumab, other medications, foods, dyes, or preservatives Pregnant or trying to get pregnant Breast-feeding How should I use this medication? This medication is injected under the skin. You will be taught how to prepare and give it. Take it as directed on the prescription label. Keep taking it unless your care team tells you to stop. It is important that you put your used needles and syringes in a special sharps container. Do not put them in a trash can. If you do not have a sharps container, call your pharmacist or care team to get one. Talk to your care team about the use of this medication in children. Special care may be needed. Overdosage: If you think you have taken too much of this medicine contact a poison control center or emergency room at once. NOTE: This medicine is only for you. Do not share this medicine with others. What if I miss a dose? If you miss a dose,  take it as soon as you can. If it is almost time for your next dose, take only that dose. Do not take double or extra doses. What may interact with this medication? Interactions are not expected. This list may not describe all possible interactions. Give your health care provider a list of all the medicines, herbs, non-prescription drugs, or dietary supplements you use. Also tell them if you smoke, drink alcohol,  or use illegal drugs. Some items may interact with your medicine. What should I watch for while using this medication? Visit your care team for regular checks on your progress. Tell your care team if your symptoms do not start to get better or if they get worse. What side effects may I notice from receiving this medication? Side effects that you should report to your care team as soon as possible: Allergic reactions or angioedema--skin rash, itching or hives, swelling of the face, eyes, lips, tongue, arms, or legs, trouble swallowing or breathing Side effects that usually do not require medical attention (report to your care team if they continue or are bothersome): Pain, redness, or irritation at injection site This list may not describe all possible side effects. Call your doctor for medical advice about side effects. You may report side effects to FDA at 1-800-FDA-1088. Where should I keep my medication? Keep out of the reach of children and pets. Store in a refrigerator or at room temperature between 20 and 25 degrees C (68 and 77 degrees F). Refrigeration (preferred): Store in the refrigerator. Do not freeze. Keep in the original container until you are ready to take it. Remove the dose from the carton about 30 minutes before it is time for you to use it. If the dose is not used, it may be stored in original container at room temperature for 7 days. Get rid of any unused medication after the expiration date. Room Temperature: This medication may be stored at room temperature for up to 7 days. Keep it in the original container. Protect from light until time of use. If it is stored at room temperature, get rid of any unused medication after 7 days or after it expires, whichever is first. To get rid of medications that are no longer needed or have expired: Take the medication to a medication take-back program. Check with your pharmacy or law enforcement to find a location. If you cannot return the  medication, ask your pharmacist or care team how to get rid of this medication safely. NOTE: This sheet is a summary. It may not cover all possible information. If you have questions about this medicine, talk to your doctor, pharmacist, or health care provider.  2023 Elsevier/Gold Standard (2021-07-18 00:00:00)

## 2021-12-28 NOTE — Progress Notes (Signed)
[id number]    Provider:  Dr Lucia Gaskins Requesting Provider: Fatima Sanger, FNP Primary Care Provider:  Merri Brunette, MD  CC: Migraines  HPI:  Carl Kelly is a 51 y.o. male here as requested by Fatima Sanger, FNP for migraines.  Past medical history hypercholesterolemia, GERD, GAD, irregular heartbeat, hypertension, acute prostatitis, chronic migraines.  I reviewed Dr. Dorthula Nettles notes, migraines are worse, insurance would not cover Aimovig, it worked well but it is not covered, migraines as a teenager, went away at age 42, increasing headaches in the past, tried samples of Ubrelvy and Nurtec which helped temporarily early, had sumatriptan before and did not work either, headaches have all been similar to the ones he had a younger age, states "they are horrible", left side only, during these times become sensitive to light and sound, occasionally associated with flashes of light during the headache but not prior, can last 8 to 12 hours and become incapacitating, having headaches daily, failed Advil Goody's, Excedrin, Aleve, and sumatriptan hand, he has tried other headache medications, unsure of triggers, never drinks alcohol or smokes, admits to stress, admits to aura with flashing green lights bilaterally, has had eyes examined and are normal, severe migraines 1-2 a month and headaches daily, wants to go back to Aimov[place] or similar med because it seemed to stop the migraines completely, says his migraines are debilitating and that he misses work, migraine is 8 to 10 hours of misery admits to nausea vomiting with the migraines.  Patient is here alone, migraines have been ongoing since a teenager, they can be unilateral or bilateral, pulsating pounding throbbing, photophobia and phonophobia, he can have an aura occasionally with flashing green lights bilaterally, has had his eyes examined and are normal, they can last 8 to 12 hours at a time and can be miserable, severe, no  medication overuse, also +nausea, +vomiting, he has had them all his life as a teenager and they got better in his 71s. In the last few years they have progressively worsened, exactly like prior migraines no changes in quality, not positional or exertional, no vision changes. 16 headache days a month and >8 moderate to severe migraines a month > 1 year. No medication overuse. No other focal neurologic deficits, associated symptoms, inciting events or modifiable factors.   Reviewed notes, labs and imaging from outside physicians, which showed:  Labs August 22, 2021 include hemoglobin A1c 5.9, TSH normal 1.4, CBC unremarkable, CMP BUN 7 and creatinine 1.03 states a CT of the head was ordered but I do not see those results.  From a thorough review of records, medications tried that can be used in migraine management includes metoprolol, Cymbalta, amitriptyline/nortriptyline (sedation, sumatriptan and Maxalt, nurtec, ubrelvy, emgality, ondansetron, topiramate(side effects), aimovig contraindicted due to constipation  Review of Systems: Patient complains of symptoms per HPI as well as the following symptoms migraines. Pertinent negatives and positives per HPI. All others negative.   Social History   Socioeconomic History   Marital status: Divorced    Spouse name: Not on file   Number of children: 1   Years of education: Not on file   Highest education level: Not on file  Occupational History   Occupation: maint. tech  Tobacco Use   Smoking status: Never   Smokeless tobacco: Never  Substance and Sexual Activity   Alcohol use: Yes    Comment: rare   Drug use: No   Sexual activity: Yes  Other Topics Concern   Not on  file  Social History Narrative   Not on file   Social Determinants of Health   Financial Resource Strain: Not on file  Food Insecurity: Not on file  Transportation Needs: Not on file  Physical Activity: Not on file  Stress: Not on file  Social Connections: Not on file   Intimate Partner Violence: Not on file    Family History  Problem Relation Age of Onset   Polycythemia Mother    Migraines Mother    Heart disease Father    Diabetes Paternal Grandmother    Stomach cancer Paternal Grandfather    Colon cancer Neg Hx     Past Medical History:  Diagnosis Date   Anxiety    Arrhythmia    heart   GAD (generalized anxiety disorder)    Gallbladder polyp    GERD (gastroesophageal reflux disease)    Hyperlipidemia    Hypertension    Kidney stones    Kidney stones    Migraine with aura     Patient Active Problem List   Diagnosis Date Noted   Chronic migraine without aura without status migrainosus, not intractable 12/28/2021   RUQ abdominal pain 04/23/2014   PALPITATIONS 09/29/2008   ANXIETY 09/22/2008   HEADACHE 09/22/2008    Past Surgical History:  Procedure Laterality Date   INGUINAL HERNIA REPAIR Left    RETINAL DETACHMENT SURGERY Bilateral    ROTATOR CUFF REPAIR Right    thumb surgery Right    TONSILLECTOMY      Current Outpatient Medications  Medication Sig Dispense Refill   atorvastatin (LIPITOR) 20 MG tablet Take 1 tablet by mouth daily.     DULoxetine (CYMBALTA) 60 MG capsule Take 60 mg by mouth daily.     Galcanezumab-gnlm (EMGALITY) 120 MG/ML SOAJ Inject 120 mg into the skin every 30 (thirty) days. 1.12 mL 11   Multiple Vitamin (MULTIVITAMIN WITH MINERALS) TABS tablet Take 1 tablet by mouth daily.     omeprazole (PRILOSEC) 40 MG capsule Take 1 capsule (40 mg total) by mouth 2 (two) times daily. 60 capsule 2   rizatriptan (MAXALT-MLT) 10 MG disintegrating tablet Take 1 tablet (10 mg total) by mouth as needed for migraine. May repeat in 2 hours if needed 9 tablet 11   No current facility-administered medications for this visit.    Allergies as of 12/28/2021 - Review Complete 12/28/2021  Allergen Reaction Noted   Meperidine hcl Anaphylaxis    Ampicillin Rash     Vitals: BP 138/90   Pulse 92   Ht 5\' 11"  (1.803 m)   Wt  197 lb 6.4 oz (89.5 kg)   BMI 27.53 kg/m  Last Weight:  Wt Readings from Last 1 Encounters:  12/28/21 197 lb 6.4 oz (89.5 kg)   Last Height:   Ht Readings from Last 1 Encounters:  12/28/21 5\' 11"  (1.803 m)     Physical exam: Exam: Gen: NAD, conversant, well nourised, well groomed                     CV: RRR, no MRG. No Carotid Bruits. No peripheral edema, warm, nontender Eyes: Conjunctivae clear without exudates or hemorrhage  Neuro: Detailed Neurologic Exam  Speech:    Speech is normal; fluent and spontaneous with normal comprehension.  Cognition:    The patient is oriented to person, place, and time;     recent and remote memory intact;     language fluent;     normal attention, concentration,  fund of knowledge Cranial Nerves:    The pupils are equal, round, and reactive to light. The fundi are flat. Visual fields are full to finger confrontation. Extraocular movements are intact. Trigeminal sensation is intact and the muscles of mastication are normal. The face is symmetric. The palate elevates in the midline. Hearing intact. Voice is normal. Shoulder shrug is normal. The tongue has normal motion without fasciculations.   Coordination:    Normal   Gait:    normal.   Motor Observation:    No asymmetry, no atrophy, and no involuntary movements noted. Tone:    Normal muscle tone.    Posture:    Posture is normal. normal erect    Strength:    Strength is V/V in the upper and lower limbs.      Sensation: intact to LT     Reflex Exam:  DTR's:    Deep tendon reflexes in the upper and lower extremities are normal bilaterally.   Toes:    The toes are downgoing bilaterally.   Clonus:    Clonus is absent.    Assessment/Plan:  Patient with chronic migraines. No red flags currently for brain imaging but low threshold if not improving or new/concerning symptoms.   - Start Emgality prevention once monthly - did great on it, will try to get approved - stop  sumatriptan - Acutely: Rizatriptan and Ondansetron. Take RIGHT at onset, repeat in 2 hours if needed. Max twice a day.  From a thorough review of records, medications tried that can be used in migraine management includes metoprolol(side effects), Cymbalta, amitriptyline/nortriptyline (sedation), sumatriptan(stopped working) Maxalt, nurtec, [employer] development [employer], emgality(worked fantastic), ondansetron, topiramate(side effects), aimovig contraindicted due to constipation  Discussed: To prevent or relieve headaches, try the following: Cool Compress. Lie down and place a cool compress on your head.  Avoid headache triggers. If certain foods or odors seem to have triggered your migraines in the past, avoid them. A headache diary might help you identify triggers.  Include physical activity in your daily routine. Try a daily walk or other moderate aerobic exercise.  Manage stress. Find healthy ways to cope with the stressors, such as delegating tasks on your to-do list.  Practice relaxation techniques. Try deep breathing, yoga, massage and visualization.  Eat regularly. Eating regularly scheduled meals and maintaining a healthy diet might help prevent headaches. Also, drink plenty of fluids.  Follow a regular sleep schedule. Sleep deprivation might contribute to headaches Consider biofeedback. With this mind-body technique, you learn to control certain bodily functions -- such as muscle tension, heart rate and blood pressure -- to prevent headaches or reduce headache pain.    Proceed to emergency room if you experience new or worsening symptoms or symptoms do not resolve, if you have new neurologic symptoms or if headache is severe, or for any concerning symptom.   Provided education and documentation from American headache Society toolbox including articles on: chronic migraine medication overuse headache, chronic migraines, prevention of migraines, behavioral and other nonpharmacologic treatments for  headache.   Meds ordered this encounter  Medications   rizatriptan (MAXALT-MLT) 10 MG disintegrating tablet    Sig: Take 1 tablet (10 mg total) by mouth as needed for migraine. May repeat in 2 hours if needed    Dispense:  9 tablet    Refill:  11   Galcanezumab-gnlm (EMGALITY) 120 MG/ML SOAJ    Sig: Inject 120 mg into the skin every 30 (thirty) days.    Dispense:  1.12 mL  Refill:  11    Cc: Fatima Sanger, FNP,  Merri Brunette, MD  Naomie Dean, MD  [facility] [address] [place], [place] [address]  Phone [phone] Fax [phone]  I spent 60 minutes of face-to-face and non-face-to-face time with patient on the  1. Chronic migraine without aura without status migrainosus, not intractable   2. Migraine with aura and without status migrainosus, not intractable    diagnosis.  This included previsit chart review, lab review, study review, order entry, electronic health record documentation, patient education on the different diagnostic and therapeutic options, counseling and coordination of care, risks and benefits of management, compliance, or risk factor reduction

## 2022-01-16 ENCOUNTER — Encounter: Payer: Self-pay | Admitting: Neurology

## 2022-01-16 DIAGNOSIS — G43709 Chronic migraine without aura, not intractable, without status migrainosus: Secondary | ICD-10-CM

## 2022-01-17 MED ORDER — EMGALITY 120 MG/ML ~~LOC~~ SOAJ: 120.0000 mg | SUBCUTANEOUS | 11 refills | Status: DC

## 2022-01-17 MED ORDER — RIZATRIPTAN BENZOATE 10 MG PO TBDP: 10.0000 mg | ORAL_TABLET | ORAL | 11 refills | Status: DC | PRN

## 2022-01-25 NOTE — Telephone Encounter (Signed)
Started PA on Cover My Meds but insurance requires trials of Aimovig and Ajovy first. Patient has tried Aimovig but not Ajovy.

## 2022-05-01 ENCOUNTER — Telehealth: Payer: Self-pay | Admitting: *Deleted

## 2022-05-01 ENCOUNTER — Telehealth (INDEPENDENT_AMBULATORY_CARE_PROVIDER_SITE_OTHER): Payer: 59 | Admitting: Neurology

## 2022-05-01 DIAGNOSIS — G43709 Chronic migraine without aura, not intractable, without status migrainosus: Secondary | ICD-10-CM | POA: Diagnosis not present

## 2022-05-01 MED ORDER — NURTEC 75 MG PO TBDP: 75.0000 mg | ORAL_TABLET | Freq: Every day | ORAL | 11 refills | Status: DC | PRN

## 2022-05-01 MED ORDER — EMGALITY 120 MG/ML ~~LOC~~ SOAJ: 120.0000 mg | SUBCUTANEOUS | 11 refills | Status: DC

## 2022-05-01 NOTE — Progress Notes (Signed)
[facility]    Provider:  Dr Jaynee Eagles Requesting Provider: Deland Pretty, MD Primary Care Provider:  Deland Pretty, MD  CC: Migraines  05/01/2022: Did great on Emgality, had problems with insurance, rizatriptan helps but didn't go away. While on emgality he has 4 migraines a month but we have to get it approved and nurtec worked best. I am calling the pharmacy. Refilled script and put in copay card information, discussed nurtec and ASPN. Working on [employer] for [employer] right now. Ajovy did not work.  Patient complains of symptoms per HPI as well as the following symptoms: migrianes . Pertinent negatives and positives per HPI. All others negative  HPI:  Carl Kelly is a 51 y.o. male here as requested by Deland Pretty, MD for migraines.  Past medical history hypercholesterolemia, GERD, GAD, irregular heartbeat, hypertension, acute prostatitis, chronic migraines.  I reviewed Dr. Lambert Mody notes, migraines are worse, insurance would not cover Aimovig, it worked well but it is not covered, migraines as a teenager, went away at age 23, increasing headaches in the past, tried samples of Ubrelvy and Nurtec which helped temporarily early, had sumatriptan before and did not work either, headaches have all been similar to the ones he had a younger age, states "they are horrible", left side only, during these times become sensitive to light and sound, occasionally associated with flashes of light during the headache but not prior, can last 8 to 12 hours and become incapacitating, having headaches daily, failed Advil Goody's, Excedrin, Aleve, and sumatriptan hand, he has tried other headache medications, unsure of triggers, never drinks alcohol or smokes, admits to stress, admits to aura with flashing green lights bilaterally, has had eyes examined and are normal, severe migraines 1-2 a month and headaches daily, wants to go back to [place] or similar med because it seemed to stop the migraines  completely, says his migraines are debilitating and that he misses work, migraine is 8 to 10 hours of misery admits to nausea vomiting with the migraines.  Patient is here alone, migraines have been ongoing since a teenager, they can be unilateral or bilateral, pulsating pounding throbbing, photophobia and phonophobia, he can have an aura occasionally with flashing green lights bilaterally, has had his eyes examined and are normal, they can last 8 to 12 hours at a time and can be miserable, severe, no medication overuse, also +nausea, +vomiting, he has had them all his life as a teenager and they got better in his 40s. In the last few years they have progressively worsened, exactly like prior migraines no changes in quality, not positional or exertional, no vision changes. 16 headache days a month and >8 moderate to severe migraines a month > 1 year. No medication overuse. No other focal neurologic deficits, associated symptoms, inciting events or modifiable factors.   Reviewed notes, labs and imaging from outside physicians, which showed:  Labs August 22, 2021 include hemoglobin A1c 5.9, TSH normal 1.4, CBC unremarkable, CMP BUN 7 and creatinine 1.03 states a CT of the head was ordered but I do not see those results.  From a thorough review of records, medications tried that can be used in migraine management includes metoprolol, Cymbalta, amitriptyline/nortriptyline (sedation, sumatriptan and Maxalt, nurtec, ubrelvy, emgality, ondansetron, topiramate(side effects), aimovig contraindicted due to constipation, Ajovy did not work  Review of Systems: Patient complains of symptoms per HPI as well as the following symptoms migraines. Pertinent negatives and positives per HPI. All others negative.   Social History   Socioeconomic  History   Marital status: Divorced    Spouse name: Not on file   Number of children: 1   Years of education: Not on file   Highest education level: Not on file   Occupational History   Occupation: maint. tech  Tobacco Use   Smoking status: Never   Smokeless tobacco: Never  Substance and Sexual Activity   Alcohol use: Yes    Comment: rare   Drug use: No   Sexual activity: Yes  Other Topics Concern   Not on file  Social History Narrative   Not on file   Social Determinants of Health   Financial Resource Strain: Not on file  Food Insecurity: Not on file  Transportation Needs: Not on file  Physical Activity: Not on file  Stress: Not on file  Social Connections: Not on file  Intimate Partner Violence: Not on file    Family History  Problem Relation Age of Onset   Polycythemia Mother    Migraines Mother    Heart disease Father    Diabetes Paternal Grandmother    Stomach cancer Paternal Grandfather    Colon cancer Neg Hx     Past Medical History:  Diagnosis Date   Anxiety    Arrhythmia    heart   GAD (generalized anxiety disorder)    Gallbladder polyp    GERD (gastroesophageal reflux disease)    Hyperlipidemia    Hypertension    Kidney stones    Kidney stones    Migraine with aura     Patient Active Problem List   Diagnosis Date Noted   Chronic migraine without aura without status migrainosus, not intractable 12/28/2021   RUQ abdominal pain 04/23/2014   PALPITATIONS 09/29/2008   ANXIETY 09/22/2008   HEADACHE 09/22/2008    Past Surgical History:  Procedure Laterality Date   INGUINAL HERNIA REPAIR Left    RETINAL DETACHMENT SURGERY Bilateral    ROTATOR CUFF REPAIR Right    thumb surgery Right    TONSILLECTOMY      Current Outpatient Medications  Medication Sig Dispense Refill   Rimegepant Sulfate (NURTEC) 75 MG TBDP Take 75 mg by mouth daily as needed. For migraines. Take as close to onset of migraine as possible. One daily maximum. 16 tablet 11   atorvastatin (LIPITOR) 20 MG tablet Take 1 tablet by mouth daily.     DULoxetine (CYMBALTA) 60 MG capsule Take 60 mg by mouth daily.     Galcanezumab-gnlm  (EMGALITY) 120 MG/ML SOAJ Inject 120 mg into the skin every 30 (thirty) days. BIN [id number] PCN [id number] GRP Fcem3wb ID [id number] 1.12 mL 11   Multiple Vitamin (MULTIVITAMIN WITH MINERALS) TABS tablet Take 1 tablet by mouth daily.     omeprazole (PRILOSEC) 40 MG capsule Take 1 capsule (40 mg total) by mouth 2 (two) times daily. 60 capsule 2   rizatriptan (MAXALT-MLT) 10 MG disintegrating tablet Take 1 tablet (10 mg total) by mouth as needed for migraine. May repeat in 2 hours if needed 9 tablet 11   No current facility-administered medications for this visit.    Allergies as of 05/01/2022 - Review Complete 12/28/2021  Allergen Reaction Noted   Meperidine hcl Anaphylaxis    Ampicillin Rash     Vitals: There were no vitals taken for this visit. Last Weight:  Wt Readings from Last 1 Encounters:  12/28/21 197 lb 6.4 oz (89.5 kg)   Last Height:   Ht Readings from Last 1 Encounters:  12/28/21 5\' 11"  (1.803 m)  Physical exam: Exam: Gen: NAD, conversant      CV:  Could not perform over Web Video. Denies palpitations or chest pain or SOB. VS: Breathing at a normal rate. . Not febrile. Eyes: Conjunctivae clear without exudates or hemorrhage  Neuro: Detailed Neurologic Exam  Speech:    Speech is normal; fluent and spontaneous with normal comprehension.  Cognition:    The patient is oriented to person, place, and time;     recent and remote memory intact;     language fluent;     normal attention, concentration,     fund of knowledge Cranial Nerves:    The pupils are equal, round, and reactive to light. Attempted, Cannot perform fundoscopic exam. Visual fields are full to finger confrontation. Extraocular movements are intact.  The face is symmetric with normal sensation. The palate elevates in the midline. Hearing intact. Voice is normal. Shoulder shrug is normal. The tongue has normal motion without fasciculations.   Coordination:    Normal finger to nose  Gait:    Normal native  gait  Motor Observation:   no involuntary movements noted. Tone:    Appears normal  Posture:    Posture is normal. normal erect    Strength:    Strength is anti-gravity and symmetric in the upper and lower limbs.      Sensation: intact to LT       Assessment/Plan:  Patient with chronic migraines. No red flags currently for brain imaging but low threshold if not improving or new/concerning symptoms.   Did great on Emgality, had problems with insurance, rizatriptan helps but didn't go away. While on emgality he has 4 migraines a month but we have to get it approved and nurtec worked best. I am calling the pharmacy. Refilled script and put in copay card information, discussed nurtec and ASPN. Working on [employer] for [employer] right now. Ajovy did not work. Spoke to pharmacists, ran copay card, need a PA. Working on it now. Gave them the copay. When approved we will have to call back the pharmacy and the patient otherwise give him samples until we can get this fixed.   Meds ordered this encounter  Medications   Galcanezumab-gnlm (EMGALITY) 120 MG/ML SOAJ    Sig: Inject 120 mg into the skin every 30 (thirty) days. BIN [id number] PCN [id number] GRP Fcem3wb ID [id number]    Dispense:  1.12 mL    Refill:  11   Rimegepant Sulfate (NURTEC) 75 MG TBDP    Sig: Take 75 mg by mouth daily as needed. For migraines. Take as close to onset of migraine as possible. One daily maximum.    Dispense:  16 tablet    Refill:  11    4 migraines a month less than 10 total headache days a month. Failed sumatriptan and Maxalt     From a thorough review of records, medications tried that can be used in migraine management includes metoprolol, Cymbalta, amitriptyline/nortriptyline (sedation, sumatriptan and Maxalt, nurtec, ubrelvy, emgality, ondansetron, topiramate(side effects), aimovig contraindicted due to constipation, Ajovy did not work  Discussed: To prevent or relieve headaches, try the following: Cool Compress. Lie down  and place a cool compress on your head.  Avoid headache triggers. If certain foods or odors seem to have triggered your migraines in the past, avoid them. A headache diary might help you identify triggers.  Include physical activity in your daily routine. Try a daily walk or other moderate aerobic exercise.  Manage stress. Find healthy ways to  cope with the stressors, such as delegating tasks on your to-do list.  Practice relaxation techniques. Try deep breathing, yoga, massage and visualization.  Eat regularly. Eating regularly scheduled meals and maintaining a healthy diet might help prevent headaches. Also, drink plenty of fluids.  Follow a regular sleep schedule. Sleep deprivation might contribute to headaches Consider biofeedback. With this mind-body technique, you learn to control certain bodily functions -- such as muscle tension, heart rate and blood pressure -- to prevent headaches or reduce headache pain.    Proceed to emergency room if you experience new or worsening symptoms or symptoms do not resolve, if you have new neurologic symptoms or if headache is severe, or for any concerning symptom.   Provided education and documentation from American headache Society toolbox including articles on: chronic migraine medication overuse headache, chronic migraines, prevention of migraines, behavioral and other nonpharmacologic treatments for headache.   Meds ordered this encounter  Medications   Galcanezumab-gnlm (EMGALITY) 120 MG/ML SOAJ    Sig: Inject 120 mg into the skin every 30 (thirty) days. BIN [id number] PCN [id number] GRP Fcem3wb ID [id number]    Dispense:  1.12 mL    Refill:  11   Rimegepant Sulfate (NURTEC) 75 MG TBDP    Sig: Take 75 mg by mouth daily as needed. For migraines. Take as close to onset of migraine as possible. One daily maximum.    Dispense:  16 tablet    Refill:  11    4 migraines a month less than 10 total headache days a month. Failed sumatriptan and Maxalt    Cc: Deland Pretty, MD,  Deland Pretty, MD  Sarina Ill, MD  [facility] [address] [place], [place] [address]  Phone [phone] Fax [phone]  I spent 60 minutes of face-to-face and non-face-to-face time with patient on the  1. Chronic migraine without aura without status migrainosus, not intractable     diagnosis.  This included previsit chart review, lab review, study review, order entry, electronic health record documentation, patient education on the different diagnostic and therapeutic options, counseling and coordination of care, risks and benefits of management, compliance, or risk factor reduction

## 2022-05-01 NOTE — Telephone Encounter (Signed)
CMM PA for EMGALITY (Key: [id number]) Emgality 120MG /ML auto-injectors (migraine)  [employer]

## 2022-05-04 ENCOUNTER — Other Ambulatory Visit: Payer: Self-pay | Admitting: Neurology

## 2022-05-04 MED ORDER — CANDESARTAN CILEXETIL 4 MG PO TABS: 4.0000 mg | ORAL_TABLET | Freq: Every day | ORAL | 6 refills | Status: DC

## 2022-05-04 NOTE — Telephone Encounter (Signed)
I prescribed it. Tell him insurane may not approve it but if he goes to [email or web address] he can get it with a coupon very cheap thanks

## 2022-05-04 NOTE — Addendum Note (Signed)
Addended by: Guy Begin on: 05/04/2022 01:39 PM   Modules accepted: Orders

## 2022-05-15 ENCOUNTER — Telehealth: Payer: Self-pay | Admitting: Neurology

## 2022-05-15 NOTE — Telephone Encounter (Signed)
Carl Kelly is calling asking did the office received paper work for PA for nurtec. Stated it can be faxed to [phone]

## 2022-05-15 NOTE — Telephone Encounter (Addendum)
Approved today Request Reference Number: [id number]. NURTEC TAB 75MG  ODT is approved through 08/14/2022. Your patient may now fill this prescription and it will be covered.  Faxed approval letter to [phone]. Received a receipt of confirmation.

## 2022-05-15 NOTE — Telephone Encounter (Signed)
Completed Nurtec PA on Cover My Meds. Key: [id number]. Awaiting determination from [facility].

## 2022-05-17 NOTE — Telephone Encounter (Deleted)
Spoke with [facility]. They were unable to locate the order. They will rush the processing. I have faxed the referral again. Received a receipt of confirmation.     

## 2022-09-22 ENCOUNTER — Other Ambulatory Visit (HOSPITAL_BASED_OUTPATIENT_CLINIC_OR_DEPARTMENT_OTHER): Payer: Self-pay | Admitting: Internal Medicine

## 2022-09-22 DIAGNOSIS — E782 Mixed hyperlipidemia: Secondary | ICD-10-CM

## 2022-09-25 ENCOUNTER — Telehealth (HOSPITAL_BASED_OUTPATIENT_CLINIC_OR_DEPARTMENT_OTHER): Payer: Self-pay

## 2022-10-16 ENCOUNTER — Telehealth: Payer: Self-pay

## 2022-10-16 ENCOUNTER — Other Ambulatory Visit (HOSPITAL_COMMUNITY): Payer: Self-pay

## 2022-10-16 NOTE — Telephone Encounter (Signed)
Patient Advocate Encounter   Received notification from [facility] that prior authorization is required for Nurtec 75MG  dispersible tablets   Submitted: 10-16-2022 Key [id number]  Status is pending

## 2022-10-27 NOTE — Telephone Encounter (Signed)
Patient Advocate Encounter  Prior Authorization for Nurtec 75MG  dispersible tablets has been approved.    PA# [id number] Insurance [employer] Electronic Prior Authorization Form Effective dates: 10/17/2022 through 10/16/2023

## 2022-11-29 ENCOUNTER — Encounter: Payer: Self-pay | Admitting: Neurology

## 2022-11-29 DIAGNOSIS — G43709 Chronic migraine without aura, not intractable, without status migrainosus: Secondary | ICD-10-CM

## 2022-12-04 MED ORDER — EMGALITY 120 MG/ML ~~LOC~~ SOAJ: 240.0000 mg | Freq: Once | SUBCUTANEOUS | 0 refills | Status: AC

## 2022-12-04 NOTE — Addendum Note (Signed)
Addended by: Raynald Kemp A on: 12/04/2022 08:25 AM   Modules accepted: Orders

## 2022-12-04 NOTE — Addendum Note (Signed)
Addended by: Naomie Dean B on: 12/04/2022 05:55 PM   Modules accepted: Orders

## 2022-12-06 ENCOUNTER — Ambulatory Visit (HOSPITAL_BASED_OUTPATIENT_CLINIC_OR_DEPARTMENT_OTHER)
Admission: RE | Admit: 2022-12-06 | Discharge: 2022-12-06 | Disposition: A | Discharge status: Home / Self Care | Payer: Self-pay | Source: Ambulatory Visit | Attending: Internal Medicine | Admitting: Internal Medicine

## 2022-12-06 DIAGNOSIS — E782 Mixed hyperlipidemia: Secondary | ICD-10-CM | POA: Insufficient documentation

## 2022-12-11 ENCOUNTER — Telehealth: Payer: Self-pay | Admitting: *Deleted

## 2022-12-11 ENCOUNTER — Telehealth: Payer: Self-pay

## 2022-12-11 ENCOUNTER — Other Ambulatory Visit (HOSPITAL_COMMUNITY): Payer: Self-pay

## 2022-12-11 NOTE — Telephone Encounter (Signed)
Pharmacy Patient Advocate Encounter   Received notification from [facility] that prior authorization for Emgality 120MG /ML auto-injectors (migraine) is required/requested.   PA submitted to [facility] via CoverMyMeds Key or (Medicaid) confirmation # [id number] Status is pending

## 2022-12-11 NOTE — Telephone Encounter (Signed)
Can you all work on an [employer] PA? He is saying Geophysicist/field seismologist. He's been using Candesartan since November which was required by insurance in order to get [employer] approved.

## 2022-12-11 NOTE — Telephone Encounter (Signed)
A new telephone call encounter has been made for this PA Request-please see telephone note dated ...12/11/2022. 

## 2022-12-14 ENCOUNTER — Other Ambulatory Visit: Payer: Self-pay | Admitting: *Deleted

## 2022-12-14 DIAGNOSIS — G43709 Chronic migraine without aura, not intractable, without status migrainosus: Secondary | ICD-10-CM

## 2022-12-14 MED ORDER — EMGALITY 120 MG/ML ~~LOC~~ SOAJ
120.0000 mg | SUBCUTANEOUS | 4 refills | Status: DC
Start: 2022-12-14 — End: 2023-04-26

## 2022-12-15 ENCOUNTER — Other Ambulatory Visit (HOSPITAL_COMMUNITY): Payer: Self-pay

## 2022-12-15 NOTE — Telephone Encounter (Signed)
Pharmacy Patient Advocate Encounter  Received notification from [facility] that Prior Authorization for Emgality 120MG /ML auto-injectors (migraine) has been APPROVED from 12/14/2022 to 06/13/2023.Marland Kitchen  PA #/Case ID/Reference #:  [id number]  Copay is $110.00 per 30DS per [facility] test claim.

## 2022-12-19 ENCOUNTER — Telehealth: Payer: Self-pay

## 2022-12-19 ENCOUNTER — Other Ambulatory Visit (HOSPITAL_COMMUNITY): Payer: Self-pay

## 2022-12-19 NOTE — Telephone Encounter (Signed)
PA request has been Submitted. New Encounter created for follow up. For additional info see Pharmacy Prior Auth telephone encounter from 12/19/2022.

## 2022-12-19 NOTE — Telephone Encounter (Signed)
Thanks

## 2022-12-19 NOTE — Telephone Encounter (Signed)
The PA was done for the maintenance dose- I will submit urgent PA for loading dose.

## 2022-12-19 NOTE — Telephone Encounter (Signed)
Pharmacy Patient Advocate Encounter   Received notification from Physician's Office that prior authorization for Emgality 120MG /ML auto-injectors (migraine) Loading Dose is required/requested.   Insurance verification completed.   The patient is insured through [facility] .   Per test claim: PA submitted to [facility] via CoverMyMeds Key/confirmation #/EOC [id number] Status is pending

## 2022-12-19 NOTE — Telephone Encounter (Signed)
   The patient says he did not get the loading dose of the medication. Can you see if a prior authorization is needed for the loading dose of Emgality 240 mg? Thank you.

## 2022-12-20 NOTE — Telephone Encounter (Signed)
Approval received via fax for [facility] 12/11/22-06/13/23. I have faxed approval to [facility].

## 2022-12-21 ENCOUNTER — Other Ambulatory Visit: Payer: Self-pay | Admitting: Neurology

## 2023-04-23 ENCOUNTER — Other Ambulatory Visit: Payer: Self-pay | Admitting: Neurology

## 2023-04-23 DIAGNOSIS — G43709 Chronic migraine without aura, not intractable, without status migrainosus: Secondary | ICD-10-CM

## 2023-05-21 ENCOUNTER — Other Ambulatory Visit: Payer: Self-pay | Admitting: Neurology

## 2023-05-21 DIAGNOSIS — G43709 Chronic migraine without aura, not intractable, without status migrainosus: Secondary | ICD-10-CM

## 2023-06-06 ENCOUNTER — Other Ambulatory Visit: Payer: Self-pay | Admitting: Neurology

## 2023-06-18 ENCOUNTER — Other Ambulatory Visit (HOSPITAL_COMMUNITY): Payer: Self-pay

## 2023-07-23 ENCOUNTER — Telehealth: Payer: Self-pay | Admitting: Pharmacist

## 2023-07-23 NOTE — Telephone Encounter (Signed)
 Pharmacy Patient Advocate Encounter   Received notification from Patient Pharmacy that prior authorization for Emgality 120MG /ML auto-injectors (migraine) is required/requested.   Insurance verification completed.   The patient is insured through [facility] .   Per test claim: PA required; PA submitted to above mentioned insurance via CoverMyMeds Key/confirmation #/EOC [id number] Status is pending

## 2023-07-25 NOTE — Telephone Encounter (Signed)
 Pt needs an appt to discuss and document for other medications. We haven't seen him since 2023. I sent him a Clinical cytogeneticist message.

## 2023-07-25 NOTE — Telephone Encounter (Signed)
 Pharmacy Patient Advocate Encounter  Received notification from [facility] that Prior Authorization for  Emgality 120MG /ML  has been DENIED.  See denial reason below. No denial letter attached in CMM. Will attach denial letter to Media tab once received.   PA #/Case ID/Reference #: [id number]

## 2023-07-26 MED ORDER — NURTEC 75 MG PO TBDP: 75.0000 mg | ORAL_TABLET | Freq: Every day | ORAL | 1 refills | Status: AC | PRN

## 2023-07-26 NOTE — Addendum Note (Signed)
 Addended by: Bertram Savin on: 07/26/2023 05:14 PM   Modules accepted: Orders

## 2023-09-18 ENCOUNTER — Other Ambulatory Visit (HOSPITAL_COMMUNITY): Payer: Self-pay

## 2023-09-18 ENCOUNTER — Telehealth: Payer: Self-pay

## 2023-09-18 NOTE — Telephone Encounter (Signed)
 I notified patient. We will wait to submit until pt is seen at his visit on 4/22.

## 2023-09-18 NOTE — Telephone Encounter (Signed)
 Pharmacy Patient Advocate Encounter   Received notification from CoverMyMeds that prior authorization for Nurtec 75MG  dispersible tablets is due for renewal.   Insurance verification completed.   The patient is insured through [facility]  Action: Patient hasn't been seen in your office in over a year. Plan requires updated chart notes for PA renewal.

## 2023-09-25 ENCOUNTER — Ambulatory Visit (INDEPENDENT_AMBULATORY_CARE_PROVIDER_SITE_OTHER): Payer: 59 | Admitting: Neurology

## 2023-09-25 ENCOUNTER — Other Ambulatory Visit (HOSPITAL_COMMUNITY): Payer: Self-pay

## 2023-09-25 ENCOUNTER — Encounter: Payer: Self-pay | Admitting: Neurology

## 2023-09-25 VITALS — BP 129/83 | HR 98 | Ht 71.0 in | Wt 202.6 lb

## 2023-09-25 DIAGNOSIS — G43709 Chronic migraine without aura, not intractable, without status migrainosus: Secondary | ICD-10-CM

## 2023-09-25 MED ORDER — EMGALITY 120 MG/ML ~~LOC~~ SOAJ
240.0000 mg | Freq: Once | SUBCUTANEOUS | 0 refills | Status: DC
Start: 2023-09-25 — End: 2023-09-25

## 2023-09-25 MED ORDER — EMGALITY 120 MG/ML ~~LOC~~ SOAJ
120.0000 mg | SUBCUTANEOUS | 0 refills | Status: DC
Start: 2023-09-25 — End: 2023-09-25

## 2023-09-25 MED ORDER — EMGALITY 120 MG/ML ~~LOC~~ SOAJ
240.0000 mg | Freq: Once | SUBCUTANEOUS | 0 refills | Status: DC
Start: 2023-09-25 — End: 2023-09-26
  Filled 2023-09-25: qty 2, 1d supply, fill #0
  Filled 2023-09-26: qty 2, 30d supply, fill #0

## 2023-09-25 NOTE — Progress Notes (Signed)
 [facility]    Provider:  Dr Tresia Fruit Requesting Provider: Imelda Man, MD Primary Care Provider:  Imelda Man, MD  CC: Migraines  09/25/2023: EMgality  was working great, haven't seen him since 2023, last was approved "Received notification from [facility] that Prior Authorization for Emgality  120MG /ML auto-injectors (migraine) has been APPROVED from 12/14/2022 to 06/13/2023 " Nurtec was also approved "Request Reference Number: [id number]. NURTEC TAB 75MG  ODT is approved through 08/14/2022. Your patient may now fill this prescription and it will be covered. " And approved "Prior Authorization for Nurtec 75MG  dispersible tablets has been approved.   PA# [id number] Insurance [facility] Electronic Prior Authorization Form Effective dates: 10/17/2022 through 10/16/2023"  It appears his insurance declined the emgality  earlier this year because he had not been seen in the office despite having refills and trying all required medications. He has been off of the emgality  and now experiencing >16 moderate to severe migraine days a month and > 25 headache days a month > 3 months. On emgality  had only 4 mild migraine days a month, extremely effective. Migraines can be unilateral or bilateral, pulsating pounding throbbing, photophobia and phonophobia, movement makes it worse, he can have an aura occasionally with flashing green lights bilaterally, has had his eyes examined and are normal, they can last 12 to 24 hours at a time and can be miserable, severe, no medication overuse, also +nausea, +vomiting > 3 months, he has tried lifestyle management, sleeps well with good sleep hygiene, has been compliant with medical management, exercises and examined food and lifestyle triggers with modifications in both.   He needs a note regarding his past medication trials which include > 12 weeks of Ajovy, Aimovig contraindicated due to constipation, he has tried nurtec > 12 weeks every other day as well >12  weeks prn, ubrelvy, candesartan , metoprolol, Cymbalta, amitriptyline/nortriptyline (sedation), sumatriptan and Maxalt , nurtec, ubrelvy, emgality , ondansetron , topiramate(side effects), aimovig contraindicted due to constipation, Ajovy did not work, gabapentin, depakote, venlafafaxine,   Emgality  gave him > 75% improvement in migraine and headache severity and frequency.     05/01/2022: Did great on Emgality , had problems with insurance, rizatriptan  helps but didn't go away. While on emgality  he has 4 migraines a month but we have to get it approved and nurtec worked best. I am calling the pharmacy. Refilled script and put in copay card information, discussed nurtec and ASPN. Working on PA for emgality  right now. Ajovy did not work.  Patient complains of symptoms per HPI as well as the following symptoms: migrianes . Pertinent negatives and positives per HPI. All others negative  HPI:  Carl Kelly is a 53 y.o. male here as requested by Imelda Man, MD for migraines.  Past medical history hypercholesterolemia, GERD, GAD, irregular heartbeat, hypertension, acute prostatitis, chronic migraines.  I reviewed Dr. Rebeca Camps notes, migraines are worse, insurance would not cover Aimovig, it worked well but it is not covered, migraines as a teenager, went away at age 8, increasing headaches in the past, tried samples of Ubrelvy and Nurtec which helped temporarily early, had sumatriptan before and did not work either, headaches have all been similar to the ones he had a younger age, states "they are horrible", left side only, during these times become sensitive to light and sound, occasionally associated with flashes of light during the headache but not prior, can last 8 to 12 hours and become incapacitating, having headaches daily, failed Advil Goody's, Excedrin, Aleve, and sumatriptan hand, he has tried other headache medications, unsure of triggers,  never drinks alcohol or smokes, admits to stress, admits to aura  with flashing green lights bilaterally, has had eyes examined and are normal, severe migraines 1-2 a month and headaches daily, wants to go back to Aimovig or similar med because it seemed to stop the migraines completely, says his migraines are debilitating and that he misses work, migraine is 8 to 10 hours of misery admits to nausea vomiting with the migraines.  Patient is here alone, migraines have been ongoing since a teenager, they can be unilateral or bilateral, pulsating pounding throbbing, photophobia and phonophobia, he can have an aura occasionally with flashing green lights bilaterally, has had his eyes examined and are normal, they can last 8 to 12 hours at a time and can be miserable, severe, no medication overuse, also +nausea, +vomiting, he has had them all his life as a teenager and they got better in his 15s. In the last few years they have progressively worsened, exactly like prior migraines no changes in quality, not positional or exertional, no vision changes. 16 headache days a month and >8 moderate to severe migraines a month > 1 year. No medication overuse. No other focal neurologic deficits, associated symptoms, inciting events or modifiable factors.   Reviewed notes, labs and imaging from outside physicians, which showed:  Labs August 22, 2021 include hemoglobin A1c 5.9, TSH normal 1.4, CBC unremarkable, CMP BUN 7 and creatinine 1.03 states a CT of the head was ordered but I do not see those results.    Review of Systems: Patient complains of symptoms per HPI as well as the following symptoms migraines. Pertinent negatives and positives per HPI. All others negative.   Social History   Socioeconomic History   Marital status: Divorced    Spouse name: Not on file   Number of children: 1   Years of education: Not on file   Highest education level: Not on file  Occupational History   Occupation: maint. tech  Tobacco Use   Smoking status: Never   Smokeless tobacco: Never   Substance and Sexual Activity   Alcohol use: Yes    Comment: rare   Drug use: No   Sexual activity: Yes  Other Topics Concern   Not on file  Social History Narrative   Not on file   Social Drivers of Health   Financial Resource Strain: Not on file  Food Insecurity: Not on file  Transportation Needs: Not on file  Physical Activity: Not on file  Stress: Not on file  Social Connections: Unknown (10/14/2021)   Received from [facility], [facility]   Social Network    Social Network: Not on file  Intimate Partner Violence: Unknown (09/05/2021)   Received from [facility], [facility]   HITS    Physically Hurt: Not on file    Insult or Talk Down To: Not on file    Threaten Physical Harm: Not on file    Scream or Curse: Not on file    Family History  Problem Relation Age of Onset   Polycythemia Mother    Migraines Mother    Heart disease Father    Diabetes Paternal Grandmother    Stomach cancer Paternal Grandfather    Colon cancer Neg Hx     Past Medical History:  Diagnosis Date   Anxiety    Arrhythmia    heart   GAD (generalized anxiety disorder)    Gallbladder polyp    GERD (gastroesophageal reflux disease)    Hyperlipidemia  Hypertension    Kidney stones    Kidney stones    Migraine with aura     Patient Active Problem List   Diagnosis Date Noted   Chronic migraine without aura without status migrainosus, not intractable 12/28/2021   RUQ abdominal pain 04/23/2014   PALPITATIONS 09/29/2008   Anxiety state 09/22/2008   Headache 09/22/2008    Past Surgical History:  Procedure Laterality Date   INGUINAL HERNIA REPAIR Left    RETINAL DETACHMENT SURGERY Bilateral    ROTATOR CUFF REPAIR Right    thumb surgery Right    TONSILLECTOMY      Current Outpatient Medications  Medication Sig Dispense Refill   atorvastatin (LIPITOR) 20 MG tablet Take 1 tablet by mouth daily.     DULoxetine (CYMBALTA) 60 MG capsule Take 60 mg by mouth daily.      Multiple Vitamin (MULTIVITAMIN WITH MINERALS) TABS tablet Take 1 tablet by mouth daily.     omeprazole  (PRILOSEC) 40 MG capsule Take 1 capsule (40 mg total) by mouth 2 (two) times daily. 60 capsule 2   Rimegepant Sulfate (NURTEC) 75 MG TBDP Take 1 tablet (75 mg total) by mouth daily as needed. For migraines. Take as close to onset of migraine as possible. One daily maximum. 16 tablet 1   rizatriptan  (MAXALT -MLT) 10 MG disintegrating tablet Take 1 tablet (10 mg total) by mouth as needed for migraine. May repeat in 2 hours if needed 9 tablet 11   candesartan  (ATACAND ) 4 MG tablet TAKE 1 TABLET(4 MG) BY MOUTH DAILY (Patient not taking: Reported on 09/25/2023) 30 tablet 6   Galcanezumab -gnlm (EMGALITY ) 120 MG/ML SOAJ Inject 240 mg into the skin once for 1 dose. Fill this first, this is loading dose. Please run copay card: BIN [id number] PCN PDMI GRP [id number] ID [id number] EXP 06/04/2024 2 mL 0   No current facility-administered medications for this visit.    Allergies as of 09/25/2023 - Review Complete 09/25/2023  Allergen Reaction Noted   Meperidine hcl Anaphylaxis    Ampicillin Rash     Vitals: BP 129/83   Pulse 98   Ht 5\' 11"  (1.803 m)   Wt 202 lb 9.6 oz (91.9 kg)   BMI 28.26 kg/m  Last Weight:  Wt Readings from Last 1 Encounters:  09/25/23 202 lb 9.6 oz (91.9 kg)   Last Height:   Ht Readings from Last 1 Encounters:  09/25/23 5\' 11"  (1.803 m)    Physical exam: Exam: Gen: NAD, conversant      CV: No palpitations or chest pain or SOB. VS: Breathing at a normal rate. Weight appears within normal limits. Not febrile. Eyes: Conjunctivae clear without exudates or hemorrhage  Neuro: Detailed Neurologic Exam  Speech:    Speech is normal; fluent and spontaneous with normal comprehension.  Cognition:    The patient is oriented to person, place, and time;     recent and remote memory intact;     language fluent;     normal attention, concentration, fund of knowledge Cranial Nerves:     The pupils are equal, round, and reactive to light. Visual fields are full Extraocular movements are intact.  The face is symmetric with normal sensation. The palate elevates in the midline. Hearing intact. Voice is normal. Shoulder shrug is normal. The tongue has normal motion without fasciculations.   Coordination: normal  Gait:    No abnormalities noted or reported  Motor Observation:   no involuntary movements noted. Tone:    Appears normal  Posture:  Posture is normal. normal erect    Strength:    Strength is anti-gravity and symmetric in the upper and lower limbs.      Sensation: intact to LT, no reports of numbness or tingling or paresthesias            Assessment/Plan:  Patient with chronic migraines. Migraines can be unilateral or bilateral, pulsating pounding throbbing, photophobia and phonophobia, movement makes it worse, he can have an aura occasionally with flashing green lights bilaterally, has had his eyes examined and are normal, they can last 12 to 24 hours at a time and can be miserable, severe, no medication overuse, also +nausea, +vomiting > 3 months, he has tried lifestyle management, sleeps well with good sleep hygiene, has been compliant with medical management, exercises and examined food and lifestyle triggers with modifications in both.    On emgality  since 2023 doing exceptional but It appears his insurance declined the emgality  earlier this year because he had not been seen in the office despite having refills and trying all required medications. He has been off of the emgality  since and now experiencing >16 moderate to severe migraine days a month and > 25 headache days a month > 3 months. On emgality  had only 4 mild migraine days a month, extremely effective. Emgality  gave him > 75% improvement in migraine and headache severity and frequency.   He needs a note regarding his past medication trials which include > 12 weeks of Ajovy, Aimovig  contraindicated due to constipation, he has tried nurtec > 12 weeks every other day as well >12 weeks prn, ubrelvy, candesartan , metoprolol, Cymbalta, amitriptyline/nortriptyline (sedation), sumatriptan and Maxalt , nurtec, ubrelvy, emgality , ondansetron , topiramate(side effects), aimovig contraindicted due to constipation, Ajovy did not work, gabapentin, depakote, venlafafaxine,   Acute: rizatriptan  helps but didn't go away, nurtec helps.    Refilled script and put in copay card information, called pharmacy [facility] in [place](they have to order) will try [place] for emgality  and call him. Filled out copay card for new 2025 copay card  Meds ordered this encounter  Medications   DISCONTD: Galcanezumab -gnlm (EMGALITY ) 120 MG/ML SOAJ    Sig: Inject 120 mg into the skin every 30 (thirty) days. Fill this first, this is loading dose. Please run copay card: BIN [id number] PCN PDMI GRP [id number] ID [id number] EXP 06/04/2024    Dispense:  2 mL    Refill:  0    Please run copay card: BIN 610020 PCN PDMI GRP [id number] ID [id number] EXP 06/04/2024   DISCONTD: Galcanezumab -gnlm (EMGALITY ) 120 MG/ML SOAJ    Sig: Inject 240 mg into the skin once for 1 dose. Fill this first, this is loading dose. Please run copay card: BIN 610020 PCN PDMI GRP [id number] ID [id number] EXP 06/04/2024    Dispense:  2 mL    Refill:  0    Please run copay card: BIN 610020 PCN PDMI GRP [id number] ID [id number] EXP 06/04/2024   Galcanezumab -gnlm (EMGALITY ) 120 MG/ML SOAJ    Sig: Inject 240 mg into the skin once for 1 dose. Fill this first, this is loading dose. Please run copay card: BIN 610020 PCN PDMI GRP [id number] ID [id number] EXP 06/04/2024    Dispense:  2 mL    Refill:  0    Please run copay card: BIN 610020 PCN PDMI GRP [id number] ID [id number] EXP 06/04/2024     From a thorough review of records, medications tried that can be used in migraine management includes metoprolol, Cymbalta, amitriptyline/nortriptyline (sedation,  sumatriptan  and Maxalt , nurtec, ubrelvy, emgality , ondansetron , topiramate(side effects), aimovig contraindicted due to constipation, Ajovy did not work  Discussed: To prevent or relieve headaches, try the following: Cool Compress. Lie down and place a cool compress on your head.  Avoid headache triggers. If certain foods or odors seem to have triggered your migraines in the past, avoid them. A headache diary might help you identify triggers.  Include physical activity in your daily routine. Try a daily walk or other moderate aerobic exercise.  Manage stress. Find healthy ways to cope with the stressors, such as delegating tasks on your to-do list.  Practice relaxation techniques. Try deep breathing, yoga, massage and visualization.  Eat regularly. Eating regularly scheduled meals and maintaining a healthy diet might help prevent headaches. Also, drink plenty of fluids.  Follow a regular sleep schedule. Sleep deprivation might contribute to headaches Consider biofeedback. With this mind-body technique, you learn to control certain bodily functions -- such as muscle tension, heart rate and blood pressure -- to prevent headaches or reduce headache pain.    Proceed to emergency room if you experience new or worsening symptoms or symptoms do not resolve, if you have new neurologic symptoms or if headache is severe, or for any concerning symptom.   Provided education and documentation from American headache Society toolbox including articles on: chronic migraine medication overuse headache, chronic migraines, prevention of migraines, behavioral and other nonpharmacologic treatments for headache.   Meds ordered this encounter  Medications   DISCONTD: Galcanezumab -gnlm (EMGALITY ) 120 MG/ML SOAJ    Sig: Inject 120 mg into the skin every 30 (thirty) days. Fill this first, this is loading dose. Please run copay card: BIN 610020 PCN PDMI GRP [id number] ID [id number] EXP 06/04/2024    Dispense:  2 mL     Refill:  0    Please run copay card: BIN 610020 PCN PDMI GRP [id number] ID [id number] EXP 06/04/2024   DISCONTD: Galcanezumab -gnlm (EMGALITY ) 120 MG/ML SOAJ    Sig: Inject 240 mg into the skin once for 1 dose. Fill this first, this is loading dose. Please run copay card: BIN 610020 PCN PDMI GRP [id number] ID [id number] EXP 06/04/2024    Dispense:  2 mL    Refill:  0    Please run copay card: BIN 610020 PCN PDMI GRP [id number] ID [id number] EXP 06/04/2024   Galcanezumab -gnlm (EMGALITY ) 120 MG/ML SOAJ    Sig: Inject 240 mg into the skin once for 1 dose. Fill this first, this is loading dose. Please run copay card: BIN 610020 PCN PDMI GRP [id number] ID [id number] EXP 06/04/2024    Dispense:  2 mL    Refill:  0    Please run copay card: BIN 610020 PCN PDMI GRP [id number] ID [id number] EXP 06/04/2024    Cc: Imelda Man, MD,  Imelda Man, MD  Aldona Amel, MD  [facility] [address] [place], [place] [address]  Phone [phone] Fax [phone]  I spent 60 minutes of face-to-face and non-face-to-face time with patient on the  1. Chronic migraine without aura without status migrainosus, not intractable      diagnosis.  This included previsit chart review, lab review, study review, order entry, electronic health record documentation, patient education on the different diagnostic and therapeutic options, counseling and coordination of care, risks and benefits of management, compliance, or risk factor reduction

## 2023-09-26 ENCOUNTER — Other Ambulatory Visit (HOSPITAL_COMMUNITY): Payer: Self-pay

## 2023-09-26 ENCOUNTER — Telehealth: Payer: Self-pay | Admitting: Neurology

## 2023-09-26 ENCOUNTER — Telehealth: Payer: Self-pay

## 2023-09-26 MED ORDER — EMGALITY 120 MG/ML ~~LOC~~ SOAJ
120.0000 mg | SUBCUTANEOUS | 11 refills | Status: AC
  Filled 2023-09-26: qty 1, fill #0
  Filled 2023-09-28: qty 1, 30d supply, fill #0
  Filled 2023-10-26: qty 1, 30d supply, fill #1
  Filled 2023-11-26: qty 1, 30d supply, fill #2
  Filled 2023-12-26: qty 1, 30d supply, fill #3
  Filled 2024-01-25 – 2024-02-07 (×2): qty 1, 30d supply, fill #4
  Filled 2024-03-03: qty 1, 30d supply, fill #5
  Filled 2024-04-02: qty 1, 30d supply, fill #6
  Filled 2024-05-03: qty 1, 30d supply, fill #7
  Filled 2024-06-02: qty 1, 30d supply, fill #8
  Filled 2024-07-02: qty 1, 30d supply, fill #9

## 2023-09-26 NOTE — Telephone Encounter (Signed)
 Pharmacy Patient Advocate Encounter   Received notification from Physician's Office that prior authorization for Emgality  120MG /ML auto-injectors (migraine) is required/requested.   Insurance verification completed.   The patient is insured through [facility] .   Per test claim: PA required; PA submitted to above mentioned insurance via CoverMyMeds Key/confirmation #/EOC [id number] Status is pending

## 2023-09-26 NOTE — Telephone Encounter (Signed)
 Called [place]. Prescription for loading dose went through for no charge. [facility] [address](emgality ). We may need a PA for the monthly injections though, I left patient a message with address and phone number fyi thank you

## 2023-09-28 ENCOUNTER — Other Ambulatory Visit (HOSPITAL_COMMUNITY): Payer: Self-pay

## 2023-09-28 ENCOUNTER — Other Ambulatory Visit: Payer: Self-pay

## 2023-10-01 ENCOUNTER — Other Ambulatory Visit (HOSPITAL_COMMUNITY): Payer: Self-pay

## 2023-10-01 NOTE — Telephone Encounter (Signed)
 Pharmacy Patient Advocate Encounter  Received notification from [employer] that Prior Authorization for Emgality  120MG /ML auto-injectors (migraine) has been APPROVED from 09/27/2023 to 03/27/2024. Unable to obtain price due to refill too soon rejection, last fill date 09/28/2023 next available fill date5/18/2025   PA #/Case ID/Reference #: [id number]

## 2023-11-01 ENCOUNTER — Other Ambulatory Visit (HOSPITAL_COMMUNITY): Payer: Self-pay

## 2023-12-20 ENCOUNTER — Other Ambulatory Visit (HOSPITAL_COMMUNITY): Payer: Self-pay | Admitting: Gastroenterology

## 2023-12-20 DIAGNOSIS — R1011 Right upper quadrant pain: Secondary | ICD-10-CM

## 2023-12-24 NOTE — Progress Notes (Signed)
 Cardiology Office Note   Date:  01/02/2024  ID:  Kelly, Carl June 14, 1970, MRN [id number] PCP: Carl Nottingham, MD  [facility] Providers Cardiologist:  Lonni Cash, MD   History of Present Illness Carl Kelly is a 53 y.o. male with a past medical history of migraines, elevated coronary calcium score.  Presents today for evaluation of left-sided chest pain and palpitations.  Patient previously underwent CT calcium scoring in 12/2022 that showed a coronary calcium score of 85 (84th percentile).  Has been on Lipitor 20 mg daily.  Today, patient presents to establish care and for evaluation of palpitations and left-sided chest discomfort.  Reports that he has had palpitations for a very long time, but they seem to be worsening recently. At one point was treated with a beta blocker, he thinks metoprolol , but this made him feel poorly. Describes a feeling like his heart is racing.  Also occasionally feels like his heart is skipping beats.  Symptoms usually occur randomly.  Duration of symptoms vary, but are sometimes brief but can be more sustained.  Notes that anxiety seems to make his symptoms more noticeable.  For the past 3 weeks he has also been having left-sided chest discomfort.  Reports feeling like a sore muscle in the left side of his chest.  Symptoms not improved or worsened with palpation/massage.  Not worse with exertion.  He denies shortness of breath.  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  He has a family history of coronary artery disease, his dad had quadruple bypass when he was 98 years old.  He takes Lipitor for hypercholesterol.  His primary care provider had him wear a monitor which he had just mailed back last week.  Studies Reviewed Cardiac Studies & Procedures    ______________________________________________________________________________________________          CT SCANS  CT CARDIAC SCORING (SELF PAY ONLY) 12/06/2022  Addendum 12/06/2022  9:24 PM ADDENDUM REPORT: 12/06/2022 21:21  EXAM: OVER-READ INTERPRETATION  CT CHEST  The following report is an over-read performed by radiologist Dr. Vanetta Mortimer [facility] on 12/06/2022. This over-read does not include interpretation of cardiac or coronary anatomy or pathology. The coronary calcium score interpretation by the cardiologist is attached.  COMPARISON:  None.  FINDINGS: No hilar or mediastinal adenopathy.  Small hiatal hernia.  There is scattered micro nodularity in the lingula, likely inflammatory/infectious in etiology and age indeterminate. Clinical correlation and follow-up as clinically indicated.  No acute osseous pathology.  IMPRESSION: Scattered micro nodularity in the lingula, likely inflammatory/infectious in etiology and age indeterminate.   Electronically Signed By: Vanetta Chou M.D. On: 12/06/2022 21:21  Narrative CLINICAL DATA:  Risk stratification: 53 Year-old Male  EXAM: Coronary Calcium Score  TECHNIQUE: The patient was scanned on a [employer] scanner. Axial non-contrast 3 mm slices were carried out through the heart. The data set was analyzed on a dedicated work station and scored using the Agatson method.  FINDINGS: Non-cardiac: See separate report from [facility].  Ascending Aorta: Normal caliber. No calcifications.  Aortic Valve Calcium score: 0  Mitral annular calcification: 0  Pericardium: Normal.  Coronary arteries: Normal origins.  Coronary Calcium Score:  Left main: 0  Left anterior descending artery: 9  Left circumflex artery: 0  Right coronary artery: 76  Total: 85  Percentile: 84th for age, sex, and race matched control.  IMPRESSION: 1.  Coronary calcium score of 85. This was 84th  percentile for age, gender, and race matched controls.  RECOMMENDATIONS:  The proposed cut-off value of 1,651 AU yielded a 93 % sensitivity and 75 % specificity in grading AS severity in patients with classical low-flow, low-gradient AS. Proposed different cut-off values to define severe AS for men and women as 2,065 AU and 1,274 AU, respectively. The joint European and American recommendations for the assessment of AS consider the aortic valve calcium score as a continuum - a very high calcium score suggests severe AS and a low calcium score suggests severe AS is unlikely.  Donney VEAR Jarome LULLA Stephen RENETTE, et al. 2017 ESC/EACTS Guidelines for the management of valvular heart disease. Eur Heart J 2017;38:2739-91.  Coronary artery calcium (CAC) score is a strong predictor of incident coronary heart disease (CHD) and provides predictive information beyond traditional risk factors. CAC scoring is reasonable to use in the decision to withhold, postpone, or initiate statin therapy in intermediate-risk or selected borderline-risk asymptomatic adults (age 11-75 years and LDL-C >=70 to <190 mg/dL) who do not have diabetes or established atherosclerotic cardiovascular disease (ASCVD).* In intermediate-risk (10-year ASCVD risk >=7.5% to <20%) adults or selected borderline-risk (10-year ASCVD risk >=5% to <7.5%) adults in whom a CAC score is measured for the purpose of making a treatment decision the following recommendations have been made:  If CAC = 0, it is reasonable to withhold statin therapy and reassess in 5 to 10 years, as long as higher risk conditions are absent (diabetes mellitus, family history of premature CHD in first degree relatives (males <55 years; females <65 years), cigarette smoking, LDL >=190 mg/dL or other independent risk factors).  If CAC is 1 to 99, it is reasonable to initiate statin therapy for patients >=36 years of age.  If CAC is >=100 or >=75th percentile,  it is reasonable to initiate statin therapy at any age.  Cardiology referral should be considered for patients with CAC scores =400 or >=75th percentile.  *2018 AHA/ACC/AACVPR/AAPA/ABC/ACPM/ADA/AGS/APhA/ASPC/NLA/PCNA Guideline on the Management of Blood Cholesterol: A Report of the American College of Cardiology/American Heart Association Task Force on Clinical Practice Guidelines. J Am Coll Cardiol. 2019;73(24):3168-3209.  Stanly Leavens, MD  Electronically Signed: By: Stanly Leavens M.D. On: 12/06/2022 18:47     ______________________________________________________________________________________________      Risk Assessment/Calculations           Physical Exam VS:  BP 124/68   Pulse 81   Ht 5' 11 (1.803 m)   Wt 197 lb 6.4 oz (89.5 kg)   SpO2 98%   BMI 27.53 kg/m        Wt Readings from Last 3 Encounters:  01/02/24 197 lb 6.4 oz (89.5 kg)  09/25/23 202 lb 9.6 oz (91.9 kg)  12/28/21 197 lb 6.4 oz (89.5 kg)    GEN: Well nourished, well developed in no acute distress.  Sitting comfortably on the exam table NECK: No JVD  CARDIAC:  RRR, no murmurs, rubs, gallops RESPIRATORY:  Clear to auscultation without rales, wheezing or rhonchi.  Normal work of breathing on room air ABDOMEN: Soft, non-tender, non-distended EXTREMITIES:  No edema in bilateral lower extremities; No deformity   ASSESSMENT AND PLAN  Palpitations  - Patient reports that he has been having palpitations for years.  At one point was treated with a beta-blocker but felt poorly on this.  Currently not on any medications for palpitations.  Describes a feeling like his heart is racing.  At times feels  like his heart skips a beat. - Patient has cut back significantly on caffeine use.  He does not smoke cigarettes.  Notes that palpitations worsen with stress. We discussed triggers for palpitations and lifestyle changes such as increasing hydration, limiting stress, limiting caffeine/alcohol use   - His PCP had him wear a cardiac monitor.  He mailed this back a few days ago.  Results pending - TSH within normal limits on 7/16.  Ordered BMP, mag today - Discussed starting beta-blocker or calcium channel blocker.  As patient previously did not tolerate beta-blocker in the past, he would prefer to have monitor results back before starting medications.  Agree this is reasonable  Chest pain  Elevated Coronary Calcium Score  - Patient previously had CT calcium scoring in 12/2022 that showed coronary calcium score 85 (84th percentile) - Patient does have a family history of CAD, his dad had quadruple bypass when he was 43 years old - Patient reports having left-sided chest pain for the past 3 weeks.  Reports feeling like a muscle is sore.  Pain does not worsen with exertion.  Not reproducible on palpation - EKG today without ischemic changes  - Ordered coronary CTA  - Ordered BMP prior to scan  - ordered echocardiogram   Hyperlipidemia  - LDL 60 in 09/2023  - Continue lipitor 20 mg daily    Dispo: Follow up in 8 weeks with APP.   Signed, Rollo FABIENE Louder, PA-C

## 2024-01-01 ENCOUNTER — Ambulatory Visit (HOSPITAL_COMMUNITY)
Admission: RE | Admit: 2024-01-01 | Discharge: 2024-01-01 | Disposition: A | Discharge status: Home / Self Care | Payer: Self-pay | Source: Ambulatory Visit | Attending: Gastroenterology | Admitting: Gastroenterology

## 2024-01-01 DIAGNOSIS — R1011 Right upper quadrant pain: Secondary | ICD-10-CM | POA: Insufficient documentation

## 2024-01-01 MED ORDER — TECHNETIUM TC 99M MEBROFENIN IV KIT
5.4000 | PACK | Freq: Once | INTRAVENOUS | Status: AC
  Administered 2024-01-01: 5.4 via INTRAVENOUS

## 2024-01-02 ENCOUNTER — Encounter: Payer: Self-pay | Admitting: Cardiology

## 2024-01-02 ENCOUNTER — Ambulatory Visit: Payer: Self-pay | Attending: Cardiology | Admitting: Cardiology

## 2024-01-02 VITALS — BP 124/68 | HR 81 | Ht 71.0 in | Wt 197.4 lb

## 2024-01-02 DIAGNOSIS — R002 Palpitations: Secondary | ICD-10-CM

## 2024-01-02 DIAGNOSIS — R931 Abnormal findings on diagnostic imaging of heart and coronary circulation: Secondary | ICD-10-CM | POA: Diagnosis not present

## 2024-01-02 DIAGNOSIS — R079 Chest pain, unspecified: Secondary | ICD-10-CM | POA: Diagnosis not present

## 2024-01-02 NOTE — Patient Instructions (Addendum)
 Medication Instructions:  Metoprolol  tartrate 100 mg PO 90-120 minutes prior to scan  *If you need a refill on your cardiac medications before your next appointment, please call your pharmacy*  Lab Work: Today we are going to draw a Bmet, and Mag level If you have labs (blood work) drawn today and your tests are completely normal, you will receive your results only by: MyChart Message (if you have MyChart) OR A paper copy in the mail If you have any lab test that is abnormal or we need to change your treatment, we will call you to review the results.  Testing/Procedures: Your physician has requested that you have an echocardiogram. Echocardiography is a painless test that uses sound waves to create images of your heart. It provides your doctor with information about the size and shape of your heart and how well your heart's chambers and valves are working. This procedure takes approximately one hour. There are no restrictions for this procedure. Please do NOT wear cologne, perfume, aftershave, or lotions (deodorant is allowed). Please arrive 15 minutes prior to your appointment time.  Please note: We ask at that you not bring children with you during ultrasound (echo/ vascular) testing. Due to room size and safety concerns, children are not allowed in the ultrasound rooms during exams. Our front office staff cannot provide observation of children in our lobby area while testing is being conducted. An adult accompanying a patient to their appointment will only be allowed in the ultrasound room at the discretion of the ultrasound technician under special circumstances. We apologize for any inconvenience.  Follow-Up: At [facility], you and your health needs are our priority.  As part of our continuing mission to provide you with exceptional heart care, our providers are all part of one team.  This team includes your primary Cardiologist (physician) and Advanced Practice Providers or APPs  (Physician Assistants and Nurse Practitioners) who all work together to provide you with the care you need, when you need it.  Your next appointment:   2 month(s)  Provider:   Any APP  We recommend signing up for the patient portal called MyChart.  Sign up information is provided on this After Visit Summary.  MyChart is used to connect with patients for Virtual Visits (Telemedicine).  Patients are able to view lab/test results, encounter notes, upcoming appointments, etc.  Non-urgent messages can be sent to your provider as well.   To learn more about what you can do with MyChart, go to [email or web address].   Other Instructions   Your cardiac CT will be scheduled at one of the below locations:   [facility] [address] [place], [place] [address] [phone]  OR   [facility] [address]  [place], [place] [address]  If scheduled at [facility], please arrive at the [facility] En[facility] (Entrance C2) of [facility] 30 minutes prior to test start time. You can use the FREE valet parking offered at entrance C (encouraged to control the heart rate for the test)  Proceed to the [facility] Radiology Department (first floor) to check-in and test prep.  All radiology patients and guests should use entrance C2 at [facility], accessed from [facility], even though the hospital's physical address listed is [address].  If scheduled at the Heart and Vascular Tower at [address], please enter the parking lot using the [address] entrance and use the FREE  valet service at the patient drop-off area. Enter the building and check-in with registration on the main floor.  Please follow these instructions carefully (unless otherwise directed):  An IV will be required for this test and Nitroglycerin will be given.  Hold all erectile dysfunction medications at least 3 days (72  hrs) prior to test. (Ie viagra, cialis, sildenafil, tadalafil, etc)   On the Night Before the Test: Be sure to Drink plenty of water. Do not consume any caffeinated/decaffeinated beverages or chocolate 12 hours prior to your test. Do not take any antihistamines 12 hours prior to your test. Patient will need a prescription for Prednisone  and very clear instructions (as follows): Prednisone  50 mg - take 13 hours prior to test Take another Prednisone  50 mg 7 hours prior to test Take another Prednisone  50 mg 1 hour prior to test Take Benadryl 50 mg 1 hour prior to test Patient must complete all four doses of above prophylactic medications. Patient will need a ride after test due to Benadryl.  On the Day of the Test: Drink plenty of water until 1 hour prior to the test. Do not eat any food 1 hour prior to test. You may take your regular medications prior to the test.  Take metoprolol  (Lopressor ) two hours prior to test.      After the Test: Drink plenty of water. After receiving IV contrast, you may experience a mild flushed feeling. This is normal. On occasion, you may experience a mild rash up to 24 hours after the test. This is not dangerous. If this occurs, you can take Benadryl 25 mg, Zyrtec, Claritin, or Allegra and increase your fluid intake. (Patients taking Tikosyn should avoid Benadryl, and may take Zyrtec, Claritin, or Allegra) If you experience trouble breathing, this can be serious. If it is severe call 911 IMMEDIATELY. If it is mild, please call our office.  We will call to schedule your test 2-4 weeks out understanding that some insurance companies will need an authorization prior to the service being performed.   For more information and frequently asked questions, please visit our website : [email or web address]  For non-scheduling related questions, please contact the cardiac imaging nurse navigator should you have any questions/concerns: Cardiac Imaging Nurse  Navigators Direct Office Dial: [phone]   For scheduling needs, including cancellations and rescheduling, please call [place], [phone].

## 2024-01-03 ENCOUNTER — Ambulatory Visit: Payer: Self-pay | Admitting: Cardiology

## 2024-01-03 ENCOUNTER — Telehealth: Payer: Self-pay

## 2024-01-03 LAB — BASIC METABOLIC PANEL WITH GFR
BUN/Creatinine Ratio: 8 — ABNORMAL LOW (ref 9–20)
BUN: 7 mg/dL (ref 6–24)
CO2: 24 mmol/L (ref 20–29)
Calcium: 9.5 mg/dL (ref 8.7–10.2)
Chloride: 103 mmol/L (ref 96–106)
Creatinine, Ser: 0.92 mg/dL (ref 0.76–1.27)
Glucose: 94 mg/dL (ref 70–99)
Potassium: 4.1 mmol/L (ref 3.5–5.2)
Sodium: 142 mmol/L (ref 134–144)
eGFR: 99 mL/min/1.73 (ref >59)

## 2024-01-03 LAB — MAGNESIUM: Magnesium: 2.1 mg/dL (ref 1.6–2.3)

## 2024-01-03 MED ORDER — PREDNISONE 50 MG PO TABS: ORAL_TABLET | ORAL | 0 refills | Status: DC

## 2024-01-03 MED ORDER — METOPROLOL TARTRATE 100 MG PO TABS: 100.0000 mg | ORAL_TABLET | Freq: Once | ORAL | 0 refills | Status: DC

## 2024-01-03 NOTE — Telephone Encounter (Signed)
 Left message to call back Patient will need a prescription for Prednisone  and very clear instructions (as follows): Prednisone  50 mg - take 13 hours prior to test Take another Prednisone  50 mg 7 hours prior to test Take another Prednisone  50 mg 1 hour prior to test Take Benadryl 50 mg 1 hour prior to test Patient must complete all four doses of above prophylactic medications. Patient will need a ride after test due to Benadryl.

## 2024-01-03 NOTE — Addendum Note (Signed)
 Addended by: BYRON LEONTINE RAMAN on: 01/03/2024 08:33 AM   Modules accepted: Orders

## 2024-01-04 ENCOUNTER — Other Ambulatory Visit: Payer: Self-pay | Admitting: Cardiology

## 2024-01-05 ENCOUNTER — Other Ambulatory Visit: Payer: Self-pay | Admitting: Cardiology

## 2024-01-06 ENCOUNTER — Other Ambulatory Visit: Payer: Self-pay | Admitting: Cardiology

## 2024-01-07 ENCOUNTER — Other Ambulatory Visit: Payer: Self-pay | Admitting: Cardiology

## 2024-01-07 ENCOUNTER — Other Ambulatory Visit (HOSPITAL_COMMUNITY): Payer: Self-pay

## 2024-01-07 MED ORDER — PREDNISONE 50 MG PO TABS
ORAL_TABLET | ORAL | 0 refills | Status: DC
  Filled 2024-01-07: qty 3, 2d supply, fill #0

## 2024-01-07 MED ORDER — METOPROLOL TARTRATE 100 MG PO TABS
100.0000 mg | ORAL_TABLET | Freq: Once | ORAL | 0 refills | Status: DC
  Filled 2024-01-07: qty 1, 1d supply, fill #0

## 2024-01-08 ENCOUNTER — Other Ambulatory Visit: Payer: Self-pay | Admitting: Cardiology

## 2024-01-08 ENCOUNTER — Encounter (HOSPITAL_COMMUNITY): Payer: Self-pay

## 2024-01-08 ENCOUNTER — Telehealth (HOSPITAL_COMMUNITY): Payer: Self-pay | Admitting: *Deleted

## 2024-01-08 NOTE — Telephone Encounter (Signed)
 Attempted to call patient regarding upcoming cardiac CT appointment. Left message on voicemail with name and callback number  Larey Brick RN Navigator Cardiac Imaging [facility] [phone] Office [phone] Cell

## 2024-01-09 ENCOUNTER — Ambulatory Visit (HOSPITAL_COMMUNITY)
Admission: RE | Admit: 2024-01-09 | Discharge: 2024-01-09 | Disposition: A | Discharge status: Home / Self Care | Source: Ambulatory Visit | Attending: Cardiovascular Disease | Admitting: Cardiovascular Disease

## 2024-01-09 ENCOUNTER — Other Ambulatory Visit: Payer: Self-pay | Admitting: Cardiology

## 2024-01-09 DIAGNOSIS — I2510 Atherosclerotic heart disease of native coronary artery without angina pectoris: Secondary | ICD-10-CM

## 2024-01-09 DIAGNOSIS — K449 Diaphragmatic hernia without obstruction or gangrene: Secondary | ICD-10-CM | POA: Diagnosis not present

## 2024-01-09 DIAGNOSIS — M47814 Spondylosis without myelopathy or radiculopathy, thoracic region: Secondary | ICD-10-CM | POA: Insufficient documentation

## 2024-01-09 DIAGNOSIS — R079 Chest pain, unspecified: Secondary | ICD-10-CM | POA: Insufficient documentation

## 2024-01-09 MED ORDER — NITROGLYCERIN 0.4 MG SL SUBL
0.8000 mg | SUBLINGUAL_TABLET | Freq: Once | SUBLINGUAL | Status: AC
Start: 2024-01-09 — End: 2024-01-09
  Administered 2024-01-09: 0.8 mg via SUBLINGUAL

## 2024-01-09 MED ORDER — IOHEXOL 350 MG/ML SOLN
100.0000 mL | Freq: Once | INTRAVENOUS | Status: AC | PRN
  Administered 2024-01-09: 100 mL via INTRAVENOUS

## 2024-01-14 NOTE — Telephone Encounter (Signed)
 Left message to call back.

## 2024-01-14 NOTE — Telephone Encounter (Signed)
-----   Message from Rollo FABIENE Louder sent at 01/10/2024  1:17 PM EDT ----- Please tell patient that his recent coronary CTA showed a coronary calcium score of 134 (87th percentile). Normal ascending thoracic aorta. Nonobstrucitve CAD.   This means that he has the start of some disease in the blood vessels in his heart, but nothing that is blocking blood flow. No cause for his chest pain. Overall, this is good news! He should  continue his lipitor to help manage his cholesterol and reduce the risk of having CAD in the future   Thanks KJ    ----- Message ----- From: Interface, Rad Results In Sent: 01/09/2024  11:32 AM EDT To: Rollo JONELLE Louder, PA-C

## 2024-01-15 NOTE — Telephone Encounter (Signed)
 Left message to call back.

## 2024-02-05 ENCOUNTER — Telehealth (HOSPITAL_COMMUNITY): Payer: Self-pay | Admitting: Cardiology

## 2024-02-05 NOTE — Telephone Encounter (Signed)
 Patient called and cancelled echocardiogram for 02/07/24 and did not wish to reschedule at this time. Order will be removed from the Active echo WQ. If patient calls back we will reinstate the order. Thank you!

## 2024-02-06 ENCOUNTER — Other Ambulatory Visit (HOSPITAL_COMMUNITY): Payer: Self-pay

## 2024-02-07 ENCOUNTER — Other Ambulatory Visit (HOSPITAL_COMMUNITY): Payer: Self-pay

## 2024-02-07 ENCOUNTER — Ambulatory Visit (HOSPITAL_COMMUNITY)

## 2024-03-03 NOTE — Progress Notes (Unsigned)
 Cardiology Office Note   Date:  03/04/2024  ID:  WYNNE JURY, DOB 1970-07-13, MRN [id number] PCP: Clarice Nottingham, MD  [facility] Providers Cardiologist:  Lonni Cash, MD Cardiology APP:  Vicci Rollo SAUNDERS, PA-C   History of Present Illness NORMA IGNASIAK is a 53 y.o. male with a past medical history of migraines, elevated calcium score who recently presented for the evaluation of left-sided chest pain and palpitations here for follow-up appointment.  Workup includes CT calcium scoring 12/2022 that showed a calcium score of 85 (84th percentile).  Has been on Lipitor 20 mg daily.  He was seen January 02, 2024 to reestablish care and for evaluation of palpitations and left-sided chest discomfort.  He endorsed palpitations for a long time but they have seemed to be getting worse recently.  At 1 point was treated with beta-blocker he thinks metoprolol  but felt poorly.  Describes a feeling of his heart racing and occasionally experiencing some skipped beats.  Symptoms usually occur randomly and the duration may vary.  He noted that anxiety seems to make the symptoms more noticeable.  Over the past 3 weeks he is having left-sided chest discomfort as well and reported feeling like a sore muscle on the left side of his chest.  Symptoms had not improved or worsened with palpitations last massage.  No worse with exertion.  Denied any SOB.  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  Family history includes coronary artery disease with his dad having quadruple bypass and is 15 years old.  He takes Lipitor for hypercholesterolemia.  His PCP had him wear a monitor which he had recently mailed back.  Today, he presents with a hx of coronary artery with episodes of rapid heart rate and dizziness.  He experiences episodes of rapid heart rate, sometimes influenced by anxiety, with a notable episode last month  during yard work when his heart rate reached 170 bpm, accompanied by sweating and dizziness. These episodes are not consistently triggered by physical activity.  A CT scan of his coronary arteries showed two-vessel coronary disease, but findings were not significant enough for stenting. A cardiac monitor did not capture significant episodes of rapid heart rate, though his heart rate was around 100 bpm at rest.  Recent lab work showed normal hemoglobin, white count, kidney function, electrolytes, and liver function. A1c was in the prediabetic range, LDL was 60 mg/dL, and lipoprotein A was slightly elevated at 128 mg/dL.  Reports no shortness of breath nor dyspnea on exertion. Reports no chest pain, pressure, or tightness. No edema, orthopnea, PND.   Discussed the use of AI scribe software for clinical note transcription with the patient, who gave verbal consent to proceed.  ROS: pertinent ROS in HPI  Studies Reviewed      CT SCANS   CT CARDIAC SCORING (SELF PAY ONLY) 12/06/2022   Addendum 12/06/2022  9:24 PM ADDENDUM REPORT: 12/06/2022 21:21   EXAM: OVER-READ INTERPRETATION  CT CHEST   The following report is an over-read performed by radiologist Dr. Vanetta Mortimer [facility] on 12/06/2022. This over-read does not include interpretation of cardiac or coronary anatomy or pathology. The coronary calcium score interpretation by the cardiologist is attached.   COMPARISON:  None.   FINDINGS: No hilar or mediastinal adenopathy.   Small hiatal hernia.   There is scattered micro nodularity in the lingula, likely inflammatory/infectious in etiology and age indeterminate. Clinical correlation  and follow-up as clinically indicated.   No acute osseous pathology.   IMPRESSION: Scattered micro nodularity in the lingula, likely inflammatory/infectious in etiology and age indeterminate.     Electronically Signed By: Vanetta Chou M.D. On: 12/06/2022 21:21    Narrative CLINICAL DATA:  Risk stratification: 54 Year-old Male   EXAM: Coronary Calcium Score   TECHNIQUE: The patient was scanned on a [employer] scanner. Axial non-contrast 3 mm slices were carried out through the heart. The data set was analyzed on a dedicated work station and scored using the Agatson method.   FINDINGS: Non-cardiac: See separate report from [facility].   Ascending Aorta: Normal caliber. No calcifications.   Aortic Valve Calcium score: 0   Mitral annular calcification: 0   Pericardium: Normal.   Coronary arteries: Normal origins.   Coronary Calcium Score:   Left main: 0   Left anterior descending artery: 9   Left circumflex artery: 0   Right coronary artery: 76   Total: 85   Percentile: 84th for age, sex, and race matched control.   IMPRESSION: 1. Coronary calcium score of 85. This was 84th percentile for age, gender, and race matched controls.   RECOMMENDATIONS:   The proposed cut-off value of 1,651 AU yielded a 93 % sensitivity and 75 % specificity in grading AS severity in patients with classical low-flow, low-gradient AS. Proposed different cut-off values to define severe AS for men and women as 2,065 AU and 1,274 AU, respectively. The joint European and American recommendations for the assessment of AS consider the aortic valve calcium score as a continuum - a very high calcium score suggests severe AS and a low calcium score suggests severe AS is unlikely.   Donney VEAR Jarome LULLA Stephen RENETTE, et al. 2017 ESC/EACTS Guidelines for the management of valvular heart disease. Eur Heart J 2017;38:2739-91.   Coronary artery calcium (CAC) score is a strong predictor of incident coronary heart disease (CHD) and provides predictive information beyond traditional risk factors. CAC scoring is reasonable to use in the decision to withhold, postpone, or initiate statin therapy in intermediate-risk or selected  borderline-risk asymptomatic adults (age 51-75 years and LDL-C >=70 to <190 mg/dL) who do not have diabetes or established atherosclerotic cardiovascular disease (ASCVD).* In intermediate-risk (10-year ASCVD risk >=7.5% to <20%) adults or selected borderline-risk (10-year ASCVD risk >=5% to <7.5%) adults in whom a CAC score is measured for the purpose of making a treatment decision the following recommendations have been made:   If CAC = 0, it is reasonable to withhold statin therapy and reassess in 5 to 10 years, as long as higher risk conditions are absent (diabetes mellitus, family history of premature CHD in first degree relatives (males <55 years; females <65 years), cigarette smoking, LDL >=190 mg/dL or other independent risk factors).   If CAC is 1 to 99, it is reasonable to initiate statin therapy for patients >=68 years of age.   If CAC is >=100 or >=75th percentile, it is reasonable to initiate statin therapy at any age.   Cardiology referral should be considered for patients with CAC scores =400 or >=75th percentile.   *2018 AHA/ACC/AACVPR/AAPA/ABC/ACPM/ADA/AGS/APhA/ASPC/NLA/PCNA Guideline on the Management of Blood Cholesterol: A Report of the American College of Cardiology/American Heart Association Task Force on Clinical Practice Guidelines. J Am Coll Cardiol. 2019;73(24):3168-3209.   Stanly Leavens, MD   Electronically Signed: By: Stanly Leavens M.D. On: 12/06/2022 18:47    Physical Exam VS:  BP 128/81 (BP Location: Left Arm,  Patient Position: Sitting, Cuff Size: Normal)   Pulse 96   Resp 16   Ht 5' 11 (1.803 m)   Wt 198 lb 6.4 oz (90 kg)   SpO2 96%   BMI 27.67 kg/m        Wt Readings from Last 3 Encounters:  03/04/24 198 lb 6.4 oz (90 kg)  01/02/24 197 lb 6.4 oz (89.5 kg)  09/25/23 202 lb 9.6 oz (91.9 kg)    GEN: Well nourished, well developed in no acute distress NECK: No JVD; No carotid bruits CARDIAC: RRR, no murmurs, rubs,  gallops RESPIRATORY:  Clear to auscultation without rales, wheezing or rhonchi  ABDOMEN: Soft, non-tender, non-distended EXTREMITIES:  No edema; No deformity   ASSESSMENT AND PLAN  Likely Paroxysmal supraventricular tachycardia (SVT)  Intermittent rapid heart rate up to 170 bpm with dizziness and sweating. Previous cardiac monitor showed fast heart rate. CT scan revealed two-vessel coronary disease, not significant for stenting. Resting heart rate elevated at 100 bpm. Diagnosis: SVT. - Start low-dose beta blocker to manage heart rate and prevent spikes. - Request previous cardiac monitor and echocardiogram results from Dr. Imagene office. - Consider repeating cardiac monitor for 2 weeks or a month if episodes persist or increase in frequency. - Discuss potential insurance coverage for additional monitoring. - Discussed the need for an updated echocardiogram at some point but patient would like to hold off for now due to insurance coverage  Coronary artery disease with elevated calcium score - Continue taking your Zetia 10 mg daily -plan for annual lipid panel - LDL at goal with recent lab work from PCP      Dispo: He can follow-up with Dr. Verlin at his next available appointment  Signed, Orren LOISE Fabry, PA-C

## 2024-03-04 ENCOUNTER — Encounter: Payer: Self-pay | Admitting: Physician Assistant

## 2024-03-04 ENCOUNTER — Ambulatory Visit: Attending: Physician Assistant | Admitting: Physician Assistant

## 2024-03-04 VITALS — BP 128/81 | HR 96 | Resp 16 | Ht 71.0 in | Wt 198.4 lb

## 2024-03-04 DIAGNOSIS — E785 Hyperlipidemia, unspecified: Secondary | ICD-10-CM | POA: Diagnosis not present

## 2024-03-04 DIAGNOSIS — R002 Palpitations: Secondary | ICD-10-CM | POA: Diagnosis not present

## 2024-03-04 DIAGNOSIS — R079 Chest pain, unspecified: Secondary | ICD-10-CM

## 2024-03-04 DIAGNOSIS — R931 Abnormal findings on diagnostic imaging of heart and coronary circulation: Secondary | ICD-10-CM

## 2024-03-04 MED ORDER — METOPROLOL SUCCINATE ER 25 MG PO TB24: 25.0000 mg | ORAL_TABLET | Freq: Every day | ORAL | 3 refills | Status: AC

## 2024-03-04 NOTE — Patient Instructions (Addendum)
 Medication Instructions:   START TAKING : METOPROLOL   25 MG ONCE A DAY     *If you need a refill on your cardiac medications before your next appointment, please call your pharmacy*   Lab Work: NONE ORDERED  TODAY    If you have labs (blood work) drawn today and your tests are completely normal, you will receive your results only by: MyChart Message (if you have MyChart) OR A paper copy in the mail If you have any lab test that is abnormal or we need to change your treatment, we will call you to review the results.    Testing/Procedures:  NONE ORDERED  TODAY    Follow-Up: At [facility], you and your health needs are our priority.  As part of our continuing mission to provide you with exceptional heart care, our providers are all part of one team.  This team includes your primary Cardiologist (physician) and Advanced Practice Providers or APPs (Physician Assistants and Nurse Practitioners) who all work together to provide you with the care you need, when you need it.  Your next appointment:   NEXT AVAILABLE   Provider:  Lonni Cash, MD       We recommend signing up for the patient portal called MyChart.  Sign up information is provided on this After Visit Summary.  MyChart is used to connect with patients for Virtual Visits (Telemedicine).  Patients are able to view lab/test results, encounter notes, upcoming appointments, etc.  Non-urgent messages can be sent to your provider as well.   To learn more about what you can do with MyChart, go to [email or web address].   Other Instructions  Heart-Healthy Eating Plan Eating a healthy diet is important for the health of your heart. A heart-healthy eating plan includes: Eating less unhealthy fats. Eating more healthy fats. Eating less salt in your food. Salt is also called sodium. Making other changes in your diet. Talk with your doctor or a diet specialist (dietitian) to create an eating plan that is right for  you. What is my plan? Your doctor may recommend an eating plan that includes: Total fat: ______% or less of total calories a day. Saturated fat: ______% or less of total calories a day. Cholesterol: less than _________mg a day. Sodium: less than _________mg a day. What are tips for following this plan? Cooking Avoid frying your food. Try to bake, boil, grill, or broil it instead. You can also reduce fat by: Removing the skin from poultry. Removing all visible fats from meats. Steaming vegetables in water or broth. Meal planning  At meals, divide your plate into four equal parts: Fill one-half of your plate with vegetables and green salads. Fill one-fourth of your plate with whole grains. Fill one-fourth of your plate with lean protein foods. Eat 2-4 cups of vegetables per day. One cup of vegetables is: 1 cup (91 g) broccoli or cauliflower florets. 2 medium carrots. 1 large bell pepper. 1 large sweet potato. 1 large tomato. 1 medium white potato. 2 cups (150 g) raw leafy greens. Eat 1-2 cups of fruit per day. One cup of fruit is: 1 small apple 1 large banana 1 cup (237 g) mixed fruit, 1 large orange,  cup (82 g) dried fruit, 1 cup (240 mL) 100% fruit juice. Eat more foods that have soluble fiber. These are apples, broccoli, carrots, beans, peas, and barley. Try to get 20-30 g of fiber per day. Eat 4-5 servings of nuts, legumes, and seeds per week: 1 serving of  dried beans or legumes equals  cup (90 g) cooked. 1 serving of nuts is  oz (12 almonds, 24 pistachios, or 7 walnut halves). 1 serving of seeds equals  oz (8 g). General information Eat more home-cooked food. Eat less restaurant, buffet, and fast food. Limit or avoid alcohol. Limit foods that are high in starch and sugar. Avoid fried foods. Lose weight if you are overweight. Keep track of how much salt (sodium) you eat. This is important if you have high blood pressure. Ask your doctor to tell you more about  this. Try to add vegetarian meals each week. Fats Choose healthy fats. These include olive oil and canola oil, flaxseeds, walnuts, almonds, and seeds. Eat more omega-3 fats. These include salmon, mackerel, sardines, tuna, flaxseed oil, and ground flaxseeds. Try to eat fish at least 2 times each week. Check food labels. Avoid foods with trans fats or high amounts of saturated fat. Limit saturated fats. These are often found in animal products, such as meats, butter, and cream. These are also found in plant foods, such as palm oil, palm kernel oil, and coconut oil. Avoid foods with partially hydrogenated oils in them. These have trans fats. Examples are stick margarine, some tub margarines, cookies, crackers, and other baked goods. What foods should I eat? Fruits All fresh, canned (in natural juice), or frozen fruits. Vegetables Fresh or frozen vegetables (raw, steamed, roasted, or grilled). Green salads. Grains Most grains. Choose whole wheat and whole grains most of the time. Rice and pasta, including brown rice and pastas made with whole wheat. Meats and other proteins Lean, well-trimmed beef, veal, pork, and lamb. Chicken and [place] without skin. All fish and shellfish. Wild duck, rabbit, pheasant, and venison. Egg whites or low-cholesterol egg substitutes. Dried beans, peas, lentils, and tofu. Seeds and most nuts. Dairy Low-fat or nonfat cheeses, including ricotta and mozzarella. Skim or 1% milk that is liquid, powdered, or evaporated. Buttermilk that is made with low-fat milk. Nonfat or low-fat yogurt. Fats and oils Non-hydrogenated (trans-free) margarines. Vegetable oils, including soybean, sesame, sunflower, olive, peanut, safflower, corn, canola, and cottonseed. Salad dressings or mayonnaise made with a vegetable oil. Beverages Mineral water. Coffee and tea. Diet carbonated beverages. Sweets and desserts Sherbet, gelatin, and fruit ice. Small amounts of dark chocolate. Limit all  sweets and desserts. Seasonings and condiments All seasonings and condiments. The items listed above may not be a complete list of foods and drinks you can eat. Contact a dietitian for more options. What foods should I avoid? Fruits Canned fruit in heavy syrup. Fruit in cream or butter sauce. Fried fruit. Limit coconut. Vegetables Vegetables cooked in cheese, cream, or butter sauce. Fried vegetables. Grains Breads that are made with saturated or trans fats, oils, or whole milk. Croissants. Sweet rolls. Donuts. High-fat crackers, such as cheese crackers. Meats and other proteins Fatty meats, such as hot dogs, ribs, sausage, bacon, rib-eye roast or steak. High-fat deli meats, such as salami and bologna. Caviar. Domestic duck and goose. Organ meats, such as liver. Dairy Cream, sour cream, cream cheese, and creamed cottage cheese. Whole-milk cheeses. Whole or 2% milk that is liquid, evaporated, or condensed. Whole buttermilk. Cream sauce or high-fat cheese sauce. Yogurt that is made from whole milk. Fats and oils Meat fat, or shortening. Cocoa butter, hydrogenated oils, palm oil, coconut oil, palm kernel oil. Solid fats and shortenings, including bacon fat, salt pork, lard, and butter. Nondairy cream substitutes. Salad dressings with cheese or sour cream. Beverages Regular sodas and juice  drinks with added sugar. Sweets and desserts Frosting. Pudding. Cookies. Cakes. Pies. Milk chocolate or white chocolate. Buttered syrups. Full-fat ice cream or ice cream drinks. The items listed above may not be a complete list of foods and drinks to avoid. Contact a dietitian for more information. Summary Heart-healthy meal planning includes eating less unhealthy fats, eating more healthy fats, and making other changes in your diet. Eat a balanced diet. This includes fruits and vegetables, low-fat or nonfat dairy, lean protein, nuts and legumes, whole grains, and heart-healthy oils and fats. This information  is not intended to replace advice given to you by your health care provider. Make sure you discuss any questions you have with your health care provider. Document Revised: 06/27/2021 Document Reviewed: 06/27/2021 Elsevier Patient Education  2024 Elsevier Inc.  Low-Sodium Eating Plan Salt (sodium) helps you keep a healthy balance of fluids in your body. Too much sodium can raise your blood pressure. It can also cause fluid and waste to be held in your body. Your health care provider or dietitian may recommend a low-sodium eating plan if you have high blood pressure (hypertension), kidney disease, liver disease, or heart failure. Eating less sodium can help lower your blood pressure and reduce swelling. It can also protect your heart, liver, and kidneys. What are tips for following this plan? Reading food labels  Check food labels for the amount of sodium per serving. If you eat more than one serving, you must multiply the listed amount by the number of servings. Choose foods with less than 140 milligrams (mg) of sodium per serving. Avoid foods with 300 mg of sodium or more per serving. Always check how much sodium is in a product, even if the label says unsalted or no salt added. Shopping  Buy products labeled as low-sodium or no salt added. Buy fresh foods. Avoid canned foods and pre-made or frozen meals. Avoid canned, cured, or processed meats. Buy breads that have less than 80 mg of sodium per slice. Cooking  Eat more home-cooked food. Try to eat less restaurant, buffet, and fast food. Try not to add salt when you cook. Use salt-free seasonings or herbs instead of table salt or sea salt. Check with your provider or pharmacist before using salt substitutes. Cook with plant-based oils, such as canola, sunflower, or olive oil. Meal planning When eating at a restaurant, ask if your food can be made with less salt or no salt. Avoid dishes labeled as brined, pickled, cured, or smoked.  Avoid dishes made with soy sauce, miso, or teriyaki sauce. Avoid foods that have monosodium glutamate (MSG) in them. MSG may be added to some restaurant food, sauces, soups, bouillon, and canned foods. Make meals that can be grilled, baked, poached, roasted, or steamed. These are often made with less sodium. General information Try to limit your sodium intake to 1,500-2,300 mg each day, or the amount told by your provider. What foods should I eat? Fruits Fresh, frozen, or canned fruit. Fruit juice. Vegetables Fresh or frozen vegetables. No salt added canned vegetables. No salt added tomato sauce and paste. Low-sodium or reduced-sodium tomato and vegetable juice. Grains Low-sodium cereals, such as oats, puffed wheat and rice, and shredded wheat. Low-sodium crackers. Unsalted rice. Unsalted pasta. Low-sodium bread. Whole grain breads and whole grain pasta. Meats and other proteins Fresh or frozen meat, poultry, seafood, and fish. These should have no added salt. Low-sodium canned tuna and salmon. Unsalted nuts. Dried peas, beans, and lentils without added salt. Unsalted canned beans.  Eggs. Unsalted nut butters. Dairy Milk. Soy milk. Cheese that is naturally low in sodium, such as ricotta cheese, fresh mozzarella, or Swiss cheese. Low-sodium or reduced-sodium cheese. Cream cheese. Yogurt. Seasonings and condiments Fresh and dried herbs and spices. Salt-free seasonings. Low-sodium mustard and ketchup. Sodium-free salad dressing. Sodium-free light mayonnaise. Fresh or refrigerated horseradish. Lemon juice. Vinegar. Other foods Homemade, reduced-sodium, or low-sodium soups. Unsalted popcorn and pretzels. Low-salt or salt-free chips. The items listed above may not be all the foods and drinks you can have. Talk to a dietitian to learn more. What foods should I avoid? Vegetables Sauerkraut, pickled vegetables, and relishes. Olives. [place] fries. Onion rings. Regular canned vegetables, except  low-sodium or reduced-sodium items. Regular canned tomato sauce and paste. Regular tomato and vegetable juice. Frozen vegetables in sauces. Grains Instant hot cereals. Bread stuffing, pancake, and biscuit mixes. Croutons. Seasoned rice or pasta mixes. Noodle soup cups. Boxed or frozen macaroni and cheese. Regular salted crackers. Self-rising flour. Meats and other proteins Meat or fish that is salted, canned, smoked, spiced, or pickled. Precooked or cured meat, such as sausages or meat loaves. [place]. Ham. Pepperoni. Hot dogs. Corned beef. Chipped beef. Salt pork. Jerky. Pickled herring, anchovies, and sardines. Regular canned tuna. Salted nuts. Dairy Processed cheese and cheese spreads. Hard cheeses. Cheese curds. Blue cheese. Feta cheese. String cheese. Regular cottage cheese. Buttermilk. Canned milk. Fats and oils Salted butter. Regular margarine. Ghee. Bacon fat. Seasonings and condiments Onion salt, garlic salt, seasoned salt, table salt, and sea salt. Canned and packaged gravies. Worcestershire sauce. Tartar sauce. Barbecue sauce. Teriyaki sauce. Soy sauce, including reduced-sodium soy sauce. Steak sauce. Fish sauce. Oyster sauce. Cocktail sauce. Horseradish that you find on the shelf. Regular ketchup and mustard. Meat flavorings and tenderizers. Bouillon cubes. Hot sauce. Pre-made or packaged marinades. Pre-made or packaged taco seasonings. Relishes. Regular salad dressings. Salsa. Other foods Salted popcorn and pretzels. Corn chips and puffs. Potato and tortilla chips. Canned or dried soups. Pizza. Frozen entrees and pot pies. The items listed above may not be all the foods and drinks you should avoid. Talk to a dietitian to learn more. This information is not intended to replace advice given to you by your health care provider. Make sure you discuss any questions you have with your health care provider. Document Revised: 06/08/2022 Document Reviewed: 06/08/2022 Elsevier Patient Education   2024 [employer].

## 2024-03-12 ENCOUNTER — Telehealth: Payer: Self-pay | Admitting: Neurology

## 2024-03-12 NOTE — Telephone Encounter (Signed)
 Called and left voicemail for patient to reschedule appointment on 05/19/24 with Dr Ines.  If patient calls back, they can be rescheduled with Dr. Margaret

## 2024-03-17 ENCOUNTER — Telehealth: Payer: Self-pay

## 2024-03-17 ENCOUNTER — Other Ambulatory Visit (HOSPITAL_COMMUNITY): Payer: Self-pay

## 2024-03-17 NOTE — Telephone Encounter (Signed)
 Pharmacy Patient Advocate Encounter   Received notification from CoverMyMeds that prior authorization for Emgality  is required/requested.   Insurance verification completed.   The patient is insured through [facility].   Per test claim: PA required; PA submitted to above mentioned insurance via Latent Key/confirmation #/EOC [id number] Status is pending

## 2024-03-18 NOTE — Telephone Encounter (Signed)
 Pharmacy Patient Advocate Encounter  Received notification from [employer] that Prior Authorization for Emgality  has been APPROVED from 09/16/2023 to 09/15/2024   PA #/Case ID/Reference #: [id number]

## 2024-03-26 ENCOUNTER — Ambulatory Visit: Payer: Self-pay | Admitting: Surgery

## 2024-04-29 ENCOUNTER — Encounter: Payer: Self-pay | Admitting: Surgery

## 2024-04-29 DIAGNOSIS — K828 Other specified diseases of gallbladder: Secondary | ICD-10-CM | POA: Diagnosis present

## 2024-04-29 NOTE — H&P (Signed)
 REFERRING PHYSICIAN: Magod, Oliva Earnest, MD  PROVIDER: Durwin Davisson Carl SPINNER, MD   Chief Complaint: New Consultation (Biliary hyperkinesia)  History of Present Illness:  Patient is referred by Dr. Oliva Boots for surgical evaluation and management of right upper quadrant abdominal pain in the setting of biliary hyperkinesia. Patient's primary care physician is Dr. Ryan Hives. Patient has had a 2-year history of intermittent right upper quadrant abdominal pain. Additionally this occurred every 1 to 2 months. Recently the patient has developed symptoms that now occur several times a week. They are related to food intake but not to any particular type of food. Patient describes right upper quadrant abdominal pain which radiates to the back between the scapulae. It is associated with nausea and sweating. Patient has had a prior left inguinal hernia repair but no other abdominal surgery. There is a family history of similar symptoms in the patient's daughter who required cholecystectomy with symptomatic improvement. Patient has undergone ultrasound examination which does not demonstrate gallstones. There was a small gallbladder polyp noted. Patient underwent nuclear medicine hepatobiliary scanning in July 2025. This demonstrated a gallbladder ejection fraction of 93%. Patient is now referred for consideration for cholecystectomy for treatment of persistent right upper quadrant abdominal pain in the setting of biliary hyperkinesia. 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  Review of Systems: A complete review of systems was obtained from the patient. I have reviewed this information and discussed as appropriate with the patient. See HPI as well for other ROS.  Review of Systems  Constitutional: Positive for weight loss.  Cardiovascular: Positive for palpitations.  Gastrointestinal: Positive for abdominal pain and nausea.  Musculoskeletal: Positive for back pain.    Medical History: Past  Medical History:  Diagnosis Date  Anxiety  Arrhythmia  Gallbladder polyp  GERD (gastroesophageal reflux disease)  Hyperlipidemia  Hypertension  Kidney stones  Migraine with aura  Sleep apnea   Patient Active Problem List  Diagnosis  Abdominal pain, RUQ (right upper quadrant)  Biliary dyskinesia   Past Surgical History:  Procedure Laterality Date  ARTHROSCOPIC ROTATOR CUFF REPAIR Right  INGUINAL HERNIA REPAIR Left  Retinal detachment surgery Bilateral  thumb surgery [Other] Right  TONSILLECTOMY    Allergies  Allergen Reactions  Meperidine Anaphylaxis and Shortness Of Breath  Iodinated Contrast Media Rash and Other (See Comments)  Blistering  Ampicillin Rash   Current Outpatient Medications on File Prior to Visit  Medication Sig Dispense Refill  atorvastatin (LIPITOR) 20 MG tablet Take 20 mg by mouth once daily  DULoxetine (CYMBALTA) 60 MG DR capsule Take 60 mg by mouth once daily  EMGALITY  PEN 120 mg/mL PnIj  ezetimibe (ZETIA) 10 mg tablet Take 10 mg by mouth once daily  LORazepam (ATIVAN) 0.5 MG tablet TAKE 1 TABLET BY MOUTH TWICE DAILY FOR 9 DAYS AS NEEDED  meloxicam (MOBIC) 7.5 MG tablet TAKE 1 TABLET BY MOUTH 1 TO 2 TIMES DAILY. STOP IF BLACK STOOLS OR ABDOMINAL PAIN AND CALL MEDICAL DOCTOR  metoprolol  SUCCinate (TOPROL -XL) 25 MG XL tablet Take 25 mg by mouth  NURTEC ODT 75 mg disintegrating tablet Take 75 mg by mouth  ondansetron  (ZOFRAN ) 4 MG tablet TAKE 1 TABLET BY MOUTH EVERY DAY AS NEEDED FOR MIGRAINES  pantoprazole (PROTONIX) 40 MG DR tablet 1 tablet Orally Once a day; Duration: 90 days   No current facility-administered medications on file prior to visit.   Family History  Problem Relation Age of Onset  Migraines Mother  Polycythemia Mother  Stroke Mother  Heart disease Father  Diabetes Paternal Grandmother  Stomach cancer Paternal Grandfather  Colon cancer Neg Hx    Social History   Tobacco Use  Smoking Status Never  Smokeless Tobacco Never     Social History   Socioeconomic History  Marital status: Single  Tobacco Use  Smoking status: Never  Smokeless tobacco: Never  Substance and Sexual Activity  Drug use: Never   Social Drivers of Health   Housing Stability: Unknown (03/26/2024)  Housing Stability Vital Sign  Homeless in the Last Year: No   Objective:   Vitals:  BP: 110/71  Pulse: 76  Temp: 36.7 C (98 F)  SpO2: 98%  Weight: 90.4 kg (199 lb 4.8 oz)  Height: 180.3 cm (5' 11)  PainSc: 1  PainLoc: Abdomen   Body mass index is 27.8 kg/m.  Physical Exam   GENERAL APPEARANCE Comfortable, no acute issues Development: normal Gross deformities: none  SKIN Rash, lesions, ulcers: none Induration, erythema: none Nodules: none palpable  EYES Conjunctiva and lids: normal Pupils: equal  EARS, NOSE, MOUTH, THROAT External ears: no lesion or deformity External nose: no lesion or deformity Hearing: grossly normal  NECK Symmetric: yes Trachea: midline  CHEST/CV Not assessed  ABDOMEN Soft without distention. No obvious surgical incisions. Mild tenderness to palpation epigastrium and right upper quadrant. No guarding. No masses.  GENITOURINARY/RECTAL Not assessed  MUSCULOSKELETAL Station and gait: normal Digits and nails: no clubbing or cyanosis Muscle strength: grossly normal all extremities Deformity: none  PSYCHIATRIC Oriented to person, place, and time: yes Mood and affect: normal for situation Judgment and insight: appropriate for situation   Assessment and Plan:   Abdominal pain, RUQ (right upper quadrant) Biliary dyskinesia  Patient is referred by his gastroenterologist for surgical evaluation and recommendations regarding persistent intermittent right upper quadrant abdominal pain, nausea, sweating, radiation to the back, in the setting of biliary hyperkinesia.  Today we reviewed his clinical history and his symptoms in detail. We reviewed the results of his nuclear medicine  hepatobiliary scan. We discussed laparoscopic cholecystectomy with intraoperative cholangiography. We discussed the fact that he does not have evidence of cholelithiasis. Cholecystectomy for biliary dyskinesia has been effective and symptomatic improvement in approximately 70% of patients. Patients who remain symptomatic may require further diagnostic evaluation by their gastroenterologist.  We reviewed the procedure in detail. I provided the patient with written literature on gallbladder surgery to review at home. We discussed the size and location of the surgical incisions. We discussed the hospital stay to be anticipated. We discussed his postoperative recovery. The patient understands and wishes to proceed with surgery in the near future.  Krystal Spinner, MD [facility] practice Office: [phone]

## 2024-05-05 ENCOUNTER — Other Ambulatory Visit (HOSPITAL_COMMUNITY): Payer: Self-pay

## 2024-05-05 ENCOUNTER — Telehealth: Payer: Self-pay

## 2024-05-05 NOTE — Patient Instructions (Signed)
 SURGICAL WAITING ROOM VISITATION Patients having surgery or a procedure may have no more than 2 support people in the waiting area - these visitors may rotate in the visitor waiting room.   Due to an increase in RSV and influenza rates and associated hospitalizations, children ages 27 and under may not visit patients in [facility] hospitals. If the patient needs to stay at the hospital during part of their recovery, the visitor guidelines for inpatient rooms apply.  PRE-OP VISITATION  Pre-op nurse will coordinate an appropriate time for 1 support person to accompany the patient in pre-op.  This support person may not rotate.  This visitor will be contacted when the time is appropriate for the visitor to come back in the pre-op area.  Please refer to the [facility] website for the visitor guidelines for Inpatients (after your surgery is over and you are in a regular room).  You are not required to quarantine at this time prior to your surgery. However, you must do this: Hand Hygiene often Do NOT share personal items Notify your provider if you are in close contact with someone who has COVID or you develop fever 100.4 or greater, new onset of sneezing, cough, sore throat, shortness of breath or body aches.  If you test positive for Covid or have been in contact with anyone that has tested positive in the last 10 days please notify you surgeon.    Your procedure is scheduled on:  05/12/24  Report to [facility] Main Entrance: [place] entrance where the [employer] is available.   Report to admitting at: 9:15 AM  Call this number if you have any questions or problems the morning of surgery [phone]  FOLLOW ANY ADDITIONAL PRE OP INSTRUCTIONS YOU RECEIVED FROM YOUR SURGEON'S OFFICE!!!  Eat a light diet the day before surgery.  Examples including soups, broths, toast, yogurt, mashed potatoes.  Things to avoid include carbonated beverages (fizzy beverages), raw fruits and raw  vegetables, or beans.   If your bowels are filled with gas, your surgeon will have difficulty visualizing your pelvic organs which increases your surgical risks.  Do not eat food after Midnight the night prior to your surgery/procedure.  After Midnight you may have the following liquids until : 8:30 AM DAY OF SURGERY  Clear Liquid Diet Water Black Coffee (sugar ok, NO MILK/CREAM OR CREAMERS)  Tea (sugar ok, NO MILK/CREAM OR CREAMERS) regular and decaf                             Plain Jell-O  with no fruit (NO RED)                                           Fruit ices (not with fruit pulp, NO RED)                                     Popsicles (NO RED)  Juice: NO CITRUS JUICES: only apple, WHITE grape, WHITE cranberry Sports drinks like Gatorade or Powerade (NO RED)   Oral Hygiene is also important to reduce your risk of infection.        Remember - BRUSH YOUR TEETH THE MORNING OF SURGERY WITH YOUR REGULAR TOOTHPASTE  Do NOT smoke after Midnight the night before surgery.  STOP TAKING all Vitamins, Herbs and supplements 1 week before your surgery.   Take ONLY these medicines the morning of surgery with A SIP OF WATER: duloxetine,metoprolol ,omeprazole .  If You have been diagnosed with Sleep Apnea - Bring CPAP mask and tubing day of surgery. We will provide you with a CPAP machine on the day of your surgery.                   You may not have any metal on your body including hair pins, jewelry, and body piercing  Do not wear lotions, powders, perfumes / cologne, or deodorant  Men may shave face and neck.  Contacts, Hearing Aids, dentures or bridgework may not be worn into surgery. DENTURES WILL BE REMOVED PRIOR TO SURGERY PLEASE DO NOT APPLY Poly grip OR ADHESIVES!!!  You may bring a small overnight bag with you on the day of surgery, only pack items that are not valuable. [facility] IS NOT RESPONSIBLE   FOR VALUABLES  THAT ARE LOST OR STOLEN.   Patients discharged on the day of surgery will not be allowed to drive home.  Someone NEEDS to stay with you for the first 24 hours after anesthesia.  Do not bring your home medications to the hospital. The Pharmacy will dispense medications listed on your medication list to you during your admission in the Hospital.  Special Instructions: Bring a copy of your healthcare power of attorney and living will documents the day of surgery, if you wish to have them scanned into your [facility] Records- EPIC  Please read over the following fact sheets you were given: IF YOU HAVE QUESTIONS ABOUT YOUR PRE-OP INSTRUCTIONS, PLEASE CALL [phone]   [facility] - Preparing for Surgery      Before surgery, you can play an important role.  Because skin is not sterile, your skin needs to be as free of germs as possible.  You can reduce the number of germs on your skin by washing with CHG (chlorahexidine gluconate) soap before surgery.  CHG is an antiseptic cleaner which kills germs and bonds with the skin to continue killing germs even after washing. Please DO NOT use if you have an allergy to CHG or antibacterial soaps.  If your skin becomes reddened/irritated stop using the CHG and inform your nurse when you arrive at Short Stay. Do not shave (including legs and underarms) for at least 48 hours prior to the first CHG shower.  You may shave your face/neck.  Please follow these instructions carefully:  1.  Shower with CHG Soap the night before surgery ONLY (DO NOT USE THE SOAP THE MORNING OF SURGERY).  2.  If you choose to wash your hair, wash your hair first as usual with your normal  shampoo.  3.  After you shampoo, rinse your hair and body thoroughly to remove the shampoo.                             4.  Use CHG as you would any other liquid soap.  You can apply chg directly to the  skin and wash.  Gently with a scrungie or clean washcloth.  5.  Apply the CHG Soap to your  body ONLY FROM THE NECK DOWN.   Do not use on face/ open                           Wound or open sores. Avoid contact with eyes, ears mouth and genitals (private parts).                       Wash face,  Genitals (private parts) with your normal soap.             6.  Wash thoroughly, paying special attention to the area where your  surgery  will be performed.  7.  Thoroughly rinse your body with warm water from the neck down.  8.  DO NOT shower/wash with your normal soap after using and rinsing off the CHG Soap.                9.  Pat yourself dry with a clean towel.            10.  Wear clean pajamas.            11.  Place clean sheets on your bed the night of your first shower and do not  sleep with pets.  Day of Surgery : Do not apply any CHG, lotions/deodorants the morning of surgery.  Please wear clean clothes to the hospital/surgery center.   FAILURE TO FOLLOW THESE INSTRUCTIONS MAY RESULT IN THE CANCELLATION OF YOUR SURGERY  PATIENT SIGNATURE_________________________________  NURSE SIGNATURE__________________________________  ________________________________________________________________________

## 2024-05-05 NOTE — Telephone Encounter (Signed)
 Received a renewal request for Nurtec-per fill list in EPIC PT has not filled since 07/30/2023-Sent PT a MC to schedule appointment for updated chart notes.

## 2024-05-06 ENCOUNTER — Other Ambulatory Visit: Payer: Self-pay

## 2024-05-06 ENCOUNTER — Encounter (HOSPITAL_COMMUNITY)
Admission: RE | Admit: 2024-05-06 | Discharge: 2024-05-06 | Disposition: A | Discharge status: Home / Self Care | Source: Ambulatory Visit | Attending: Surgery | Admitting: Surgery

## 2024-05-06 ENCOUNTER — Encounter (HOSPITAL_COMMUNITY): Payer: Self-pay

## 2024-05-06 VITALS — BP 127/79 | HR 86 | Temp 98.8°F | Ht 71.0 in | Wt 198.0 lb

## 2024-05-06 DIAGNOSIS — Z01818 Encounter for other preprocedural examination: Secondary | ICD-10-CM

## 2024-05-06 DIAGNOSIS — R002 Palpitations: Secondary | ICD-10-CM | POA: Insufficient documentation

## 2024-05-06 DIAGNOSIS — Z01812 Encounter for preprocedural laboratory examination: Secondary | ICD-10-CM | POA: Insufficient documentation

## 2024-05-06 HISTORY — DX: Personal history of urinary calculi: Z87.442

## 2024-05-06 HISTORY — DX: Atherosclerotic heart disease of native coronary artery without angina pectoris: I25.10

## 2024-05-06 HISTORY — DX: Nausea with vomiting, unspecified: Z98.890

## 2024-05-06 HISTORY — DX: Other complications of anesthesia, initial encounter: T88.59XA

## 2024-05-06 LAB — BASIC METABOLIC PANEL WITH GFR
Anion gap: 11 (ref 5–15)
BUN: 5 mg/dL — ABNORMAL LOW (ref 6–20)
CO2: 24 mmol/L (ref 22–32)
Calcium: 9.7 mg/dL (ref 8.9–10.3)
Chloride: 106 mmol/L (ref 98–111)
Creatinine, Ser: 0.84 mg/dL (ref 0.61–1.24)
GFR, Estimated: >60 mL/min (ref >60)
Glucose, Bld: 114 mg/dL — ABNORMAL HIGH (ref 70–99)
Potassium: 3.9 mmol/L (ref 3.5–5.1)
Sodium: 141 mmol/L (ref 135–145)

## 2024-05-06 LAB — CBC
HCT: 43.7 % (ref 39.0–52.0)
Hemoglobin: 14.8 g/dL (ref 13.0–17.0)
MCH: 31.3 pg (ref 26.0–34.0)
MCHC: 33.9 g/dL (ref 30.0–36.0)
MCV: 92.4 fL (ref 80.0–100.0)
Platelets: 317 K/uL (ref 150–400)
RBC: 4.73 MIL/uL (ref 4.22–5.81)
RDW: 12.4 % (ref 11.5–15.5)
WBC: 6.3 K/uL (ref 4.0–10.5)
nRBC: 0 % (ref 0.0–0.2)

## 2024-05-06 NOTE — Progress Notes (Addendum)
 For Anesthesia: PCP - Clarice Nottingham, MD  Cardiologist - Verlin Lonni BIRCH, MD. Carl Kelly, Carl SAILOR, PA-C : 03/04/24   Bowel Prep reminder:N/A  Chest x-ray - CT Cardiac: 12/06/22 EKG - 01/02/24 Stress Test -  ECHO -  Cardiac Cath -  Pacemaker/ICD device last checked: Pacemaker orders received: Device Rep notified:  Spinal Cord Stimulator:N/A  Sleep Study - Yes  CPAP - Yes  Fasting Blood Sugar - N/A Checks Blood Sugar _____ times a day Date and result of last Hgb A1c-  Last dose of GLP1 agonist- N/A GLP1 instructions: Hold 7 days prior to schedule (Hold 24 hours-daily)   Last dose of SGLT-2 inhibitors- N/A SGLT-2 instructions: Hold 72 hours prior to surgery  Blood Thinner Instructions:N/A Last Dose: Time last taken:  Aspirin Instructions:N/A Last Dose: Time last taken:  Activity level: Can go up a flight of stairs and activities of daily living without stopping and without chest pain and/or shortness of breath   Able to exercise without chest pain and/or shortness of breath  Anesthesia review: Hx: HTN,Arrhythmias,CAD  Patient denies shortness of breath, fever, cough and chest pain at PAT appointment   Patient verbalized understanding of instructions that were reviewed over the telephone.

## 2024-05-12 ENCOUNTER — Ambulatory Visit (HOSPITAL_COMMUNITY)

## 2024-05-12 ENCOUNTER — Encounter (HOSPITAL_COMMUNITY): Payer: Self-pay | Admitting: Medical

## 2024-05-12 ENCOUNTER — Encounter (HOSPITAL_COMMUNITY): Payer: Self-pay | Admitting: Surgery

## 2024-05-12 ENCOUNTER — Encounter (HOSPITAL_COMMUNITY): Admission: RE | Disposition: A | Payer: Self-pay | Source: Home / Self Care | Attending: Surgery

## 2024-05-12 ENCOUNTER — Ambulatory Visit (HOSPITAL_COMMUNITY): Admission: RE | Admit: 2024-05-12 | Discharge: 2024-05-12 | Disposition: A | Attending: Surgery | Admitting: Surgery

## 2024-05-12 ENCOUNTER — Other Ambulatory Visit: Payer: Self-pay

## 2024-05-12 DIAGNOSIS — K828 Other specified diseases of gallbladder: Secondary | ICD-10-CM

## 2024-05-12 DIAGNOSIS — R1011 Right upper quadrant pain: Secondary | ICD-10-CM | POA: Diagnosis present

## 2024-05-12 HISTORY — PX: CHOLECYSTECTOMY: SHX55

## 2024-05-12 SURGERY — LAPAROSCOPIC CHOLECYSTECTOMY WITH INTRAOPERATIVE CHOLANGIOGRAM
Anesthesia: General

## 2024-05-12 MED ORDER — SUGAMMADEX SODIUM 200 MG/2ML IV SOLN
INTRAVENOUS | Status: DC | PRN
  Administered 2024-05-12: 200 mg via INTRAVENOUS

## 2024-05-12 MED ORDER — CIPROFLOXACIN IN D5W 400 MG/200ML IV SOLN
INTRAVENOUS | Status: DC | PRN
  Administered 2024-05-12: 400 mg via INTRAVENOUS

## 2024-05-12 MED ORDER — PROPOFOL 500 MG/50ML IV EMUL
INTRAVENOUS | Status: DC | PRN
  Administered 2024-05-12: 150 ug/kg/min via INTRAVENOUS

## 2024-05-12 MED ORDER — LIDOCAINE HCL (PF) 2 % IJ SOLN
INTRAMUSCULAR | Status: AC
  Filled 2024-05-12: qty 5

## 2024-05-12 MED ORDER — LACTATED RINGERS IV SOLN: INTRAVENOUS | Status: DC | PRN

## 2024-05-12 MED ORDER — ROCURONIUM BROMIDE 10 MG/ML (PF) SYRINGE
PREFILLED_SYRINGE | INTRAVENOUS | Status: DC | PRN
  Administered 2024-05-12: 50 mg via INTRAVENOUS
  Administered 2024-05-12: 5 mg via INTRAVENOUS

## 2024-05-12 MED ORDER — STERILE WATER FOR IRRIGATION IR SOLN
Status: DC | PRN
  Administered 2024-05-12: 1000 mL

## 2024-05-12 MED ORDER — MIDAZOLAM HCL (PF) 2 MG/2ML IJ SOLN
INTRAMUSCULAR | Status: DC | PRN
  Administered 2024-05-12: 2 mg via INTRAVENOUS

## 2024-05-12 MED ORDER — BUPIVACAINE-EPINEPHRINE 0.5% -1:200000 IJ SOLN
INTRAMUSCULAR | Status: DC | PRN
  Administered 2024-05-12: 30 mL

## 2024-05-12 MED ORDER — LACTATED RINGERS IR SOLN
Status: DC | PRN
  Administered 2024-05-12: 1000 mL

## 2024-05-12 MED ORDER — ONDANSETRON HCL 4 MG/2ML IJ SOLN
INTRAMUSCULAR | Status: DC | PRN
  Administered 2024-05-12: 4 mg via INTRAVENOUS

## 2024-05-12 MED ORDER — 0.9 % SODIUM CHLORIDE (POUR BTL) OPTIME
TOPICAL | Status: DC | PRN
  Administered 2024-05-12: 1000 mL

## 2024-05-12 MED ORDER — FENTANYL CITRATE (PF) 100 MCG/2ML IJ SOLN
INTRAMUSCULAR | Status: AC
  Filled 2024-05-12: qty 2

## 2024-05-12 MED ORDER — LIDOCAINE HCL (PF) 2 % IJ SOLN
INTRAMUSCULAR | Status: DC | PRN
  Administered 2024-05-12: 100 mg via INTRADERMAL

## 2024-05-12 MED ORDER — SODIUM CHLORIDE (PF) 0.9 % IJ SOLN
INTRAMUSCULAR | Status: DC | PRN
  Administered 2024-05-12: 10 mL

## 2024-05-12 MED ORDER — AMISULPRIDE (ANTIEMETIC) 5 MG/2ML IV SOLN: 10.0000 mg | Freq: Once | INTRAVENOUS | Status: DC | PRN

## 2024-05-12 MED ORDER — ONDANSETRON HCL 4 MG/2ML IJ SOLN: 4.0000 mg | Freq: Once | INTRAMUSCULAR | Status: DC | PRN

## 2024-05-12 MED ORDER — LACTATED RINGERS IV SOLN: INTRAVENOUS | Status: DC

## 2024-05-12 MED ORDER — SCOPOLAMINE 1 MG/3DAYS TD PT72
1.0000 | MEDICATED_PATCH | Freq: Once | TRANSDERMAL | Status: AC
  Administered 2024-05-12: 1 via TRANSDERMAL
  Filled 2024-05-12: qty 1

## 2024-05-12 MED ORDER — SUGAMMADEX SODIUM 200 MG/2ML IV SOLN
INTRAVENOUS | Status: AC
  Filled 2024-05-12: qty 2

## 2024-05-12 MED ORDER — ACETAMINOPHEN 500 MG PO TABS
1000.0000 mg | ORAL_TABLET | Freq: Once | ORAL | Status: AC
  Administered 2024-05-12: 1000 mg via ORAL
  Filled 2024-05-12: qty 2

## 2024-05-12 MED ORDER — FENTANYL CITRATE (PF) 50 MCG/ML IJ SOSY: 25.0000 ug | PREFILLED_SYRINGE | INTRAMUSCULAR | Status: DC | PRN

## 2024-05-12 MED ORDER — FENTANYL CITRATE (PF) 100 MCG/2ML IJ SOLN
INTRAMUSCULAR | Status: DC | PRN
  Administered 2024-05-12: 25 ug via INTRAVENOUS
  Administered 2024-05-12: 100 ug via INTRAVENOUS
  Administered 2024-05-12: 50 ug via INTRAVENOUS

## 2024-05-12 MED ORDER — CHLORHEXIDINE GLUCONATE CLOTH 2 % EX PADS: 6.0000 | MEDICATED_PAD | Freq: Once | CUTANEOUS | Status: DC

## 2024-05-12 MED ORDER — CIPROFLOXACIN IN D5W 400 MG/200ML IV SOLN
400.0000 mg | INTRAVENOUS | Status: DC
  Filled 2024-05-12: qty 200

## 2024-05-12 MED ORDER — ROCURONIUM BROMIDE 10 MG/ML (PF) SYRINGE
PREFILLED_SYRINGE | INTRAVENOUS | Status: AC
  Filled 2024-05-12: qty 10

## 2024-05-12 MED ORDER — MIDAZOLAM HCL 2 MG/2ML IJ SOLN
INTRAMUSCULAR | Status: AC
  Filled 2024-05-12: qty 2

## 2024-05-12 MED ORDER — PROPOFOL 500 MG/50ML IV EMUL
INTRAVENOUS | Status: AC
  Filled 2024-05-12: qty 50

## 2024-05-12 MED ORDER — ORAL CARE MOUTH RINSE: 15.0000 mL | Freq: Once | OROMUCOSAL | Status: AC

## 2024-05-12 MED ORDER — BUPIVACAINE-EPINEPHRINE (PF) 0.5% -1:200000 IJ SOLN
INTRAMUSCULAR | Status: AC
  Filled 2024-05-12: qty 30

## 2024-05-12 MED ORDER — CHLORHEXIDINE GLUCONATE 0.12 % MT SOLN
15.0000 mL | Freq: Once | OROMUCOSAL | Status: AC
  Administered 2024-05-12: 15 mL via OROMUCOSAL

## 2024-05-12 MED ORDER — PROPOFOL 10 MG/ML IV BOLUS
INTRAVENOUS | Status: DC | PRN
  Administered 2024-05-12: 170 mg via INTRAVENOUS

## 2024-05-12 MED ORDER — TRAMADOL HCL 50 MG PO TABS: 50.0000 mg | ORAL_TABLET | Freq: Four times a day (QID) | ORAL | 0 refills | Status: AC | PRN

## 2024-05-12 SURGICAL SUPPLY — 30 items
BAG COUNTER SPONGE SURGICOUNT (BAG) IMPLANT
CHLORAPREP W/TINT 26 (MISCELLANEOUS) ×2 IMPLANT
CLIP APPLIE ROT 10 11.4 M/L (STAPLE) ×1 IMPLANT
COVER MAYO STAND XLG (MISCELLANEOUS) ×1 IMPLANT
COVER SURGICAL LIGHT HANDLE (MISCELLANEOUS) ×1 IMPLANT
DERMABOND ADVANCED .7 DNX12 (GAUZE/BANDAGES/DRESSINGS) IMPLANT
DRAPE C-ARM 42X120 X-RAY (DRAPES) ×1 IMPLANT
ELECT REM PT RETURN 15FT ADLT (MISCELLANEOUS) ×1 IMPLANT
ENDOLOOP SUT PDS II 0 18 (SUTURE) IMPLANT
GAUZE SPONGE 2X2 8PLY STRL LF (GAUZE/BANDAGES/DRESSINGS) ×1 IMPLANT
GLOVE SURG ORTHO 8.0 STRL STRW (GLOVE) ×1 IMPLANT
GLOVE SURG SYN 7.5 PF PI (GLOVE) ×2 IMPLANT
GOWN STRL REUS W/ TWL XL LVL3 (GOWN DISPOSABLE) ×1 IMPLANT
IRRIGATION SUCT STRKRFLW 2 WTP (MISCELLANEOUS) ×1 IMPLANT
KIT BASIN OR (CUSTOM PROCEDURE TRAY) ×1 IMPLANT
KIT IMAGING PINPOINTPAQ (MISCELLANEOUS) IMPLANT
KIT TURNOVER KIT A (KITS) ×1 IMPLANT
SCISSORS LAP 5X35 DISP (ENDOMECHANICALS) ×1 IMPLANT
SET CHOLANGIOGRAPH MIX (MISCELLANEOUS) ×1 IMPLANT
SET TUBE SMOKE EVAC HIGH FLOW (TUBING) ×1 IMPLANT
SLEEVE Z-THREAD 5X100MM (TROCAR) ×1 IMPLANT
SPIKE FLUID TRANSFER (MISCELLANEOUS) ×1 IMPLANT
SUT MNCRL AB 4-0 PS2 18 (SUTURE) ×1 IMPLANT
SUT VICRYL 0 UR6 27IN ABS (SUTURE) ×1 IMPLANT
SYSTEM BAG RETRIEVAL 10MM (BASKET) ×1 IMPLANT
TOWEL OR DSP ST BLU DLX 10/PK (DISPOSABLE) ×1 IMPLANT
TRAY LAPAROSCOPIC (CUSTOM PROCEDURE TRAY) ×1 IMPLANT
TROCAR 11X100 Z THREAD (TROCAR) ×1 IMPLANT
TROCAR BALLN 12MMX100 BLUNT (TROCAR) ×1 IMPLANT
TROCAR Z-THREAD OPTICAL 5X100M (TROCAR) ×1 IMPLANT

## 2024-05-12 NOTE — Anesthesia Procedure Notes (Addendum)
 Procedure Name: Intubation Date/Time: 05/12/2024 12:42 PM  Performed by: Metta Andrea NOVAK, CRNAPre-anesthesia Checklist: Patient identified, Emergency Drugs available, Suction available and Patient being monitored Patient Re-evaluated:Patient Re-evaluated prior to induction Oxygen Delivery Method: Circle system utilized Preoxygenation: Pre-oxygenation with 100% oxygen Induction Type: IV induction Ventilation: Oral airway inserted - appropriate to patient size Laryngoscope Size: Glidescope and 4 Grade View: Grade I Tube type: Oral Tube size: 7.5 mm Number of attempts: 1 Airway Equipment and Method: Stylet and Oral airway Placement Confirmation: ETT inserted through vocal cords under direct vision, positive ETCO2 and breath sounds checked- equal and bilateral Secured at: 23 cm Tube secured with: Tape Dental Injury: Teeth and Oropharynx as per pre-operative assessment  Difficulty Due To: Difficult Airway- due to anterior larynx Comments: DL x1 with Mac 4 by Madelaine London, SRNA, DL x1 with Mac 4 by Dr. Corinne. Proceeded with glidescope 4 due to difficult anterior view.

## 2024-05-12 NOTE — Transfer of Care (Signed)
 Immediate Anesthesia Transfer of Care Note  Patient: Carl Kelly  Procedure(s) Performed: LAPAROSCOPIC CHOLECYSTECTOMY WITH INTRAOPERATIVE CHOLANGIOGRAM  Patient Location: PACU  Anesthesia Type:General  Level of Consciousness: drowsy and patient cooperative  Airway & Oxygen Therapy: Patient Spontanous Breathing and Patient connected to face mask oxygen  Post-op Assessment: Report given to RN and Post -op Vital signs reviewed and stable  Post vital signs: Reviewed and stable  Last Vitals:  Vitals Value Taken Time  BP 150/72 05/12/24 14:03  Temp    Pulse 73 05/12/24 14:04  Resp 13 05/12/24 14:04  SpO2 100 % 05/12/24 14:04  Vitals shown include unfiled device data.  Last Pain:  Vitals:   05/12/24 0945  TempSrc:   PainSc: 0-No pain         Complications: No notable events documented.

## 2024-05-12 NOTE — Interval H&P Note (Signed)
 History and Physical Interval Note:  05/12/2024 12:11 PM  Carl Kelly  has presented today for surgery, with the diagnosis of BILIARY, HYPERKINESIA, ABD PAIN.  The various methods of treatment have been discussed with the patient and family. After consideration of risks, benefits and other options for treatment, the patient has consented to    Procedure(s): LAPAROSCOPIC CHOLECYSTECTOMY WITH INTRAOPERATIVE CHOLANGIOGRAM (N/A) as a surgical intervention.    The patient's history has been reviewed, patient examined, no change in status, stable for surgery.  I have reviewed the patient's chart and labs.  Questions were answered to the patient's satisfaction.    Krystal Spinner, MD [facility] practice Office: [phone]   Krystal Spinner

## 2024-05-12 NOTE — Discharge Instructions (Addendum)
 [facility]  LAPAROSCOPIC SURGERY:  POST-OP INSTRUCTIONS  Always review your discharge instruction sheet given to you by the facility where your surgery was performed.  A prescription for pain medication may be given to you upon discharge.  Take your pain medication as prescribed.  If narcotic pain medicine is not needed, then you may take acetaminophen (Tylenol) or ibuprofen (Advil) as needed.  Take your usually prescribed medications unless otherwise directed.  If you need a refill on your pain medication, please contact your pharmacy.  They will contact our office to request authorization. Prescriptions will not be filled after 5 P.M. or on weekends.  You should follow a light diet the first few days after arrival home, such as soup and crackers or toast.  Be sure to include plenty of fluids daily.  Most patients will experience some swelling and bruising in the area of the incisions.  Ice packs will help.  Swelling and bruising can take several days to resolve.   It is common to experience some constipation after surgery.  Increasing fluid intake and taking a stool softener (such as Colace) will usually help or prevent this problem from occurring.  A mild laxative (Milk of Magnesia or Miralax) should be taken according to package instructions if there has been no bowel movement after 48 hours.  You will likely have Dermabond (topical glue) over your incisions.  This seals the incisions and allows you to bathe and shower at any time after your surgery.  Glue should remain in place for up to 10 days.  It may be removed after 10 days by pealing off the Dermabond material or using Vaseline or naval jelly to remove.  If you have steri-strips over your incisions, you may remove the gauze bandage on the second day after surgery, and you may shower at that time.  Leave your steri-strips (small skin tapes) in place directly over the incision.  These strips should remain on the  skin for 5-7 days and then be removed.  You may get them wet in the shower and pat them dry.  Any sutures or staples will be removed at the office during your follow-up visit.  ACTIVITIES:  You may resume regular (light) daily activities beginning the next day - such as daily self-care, walking, climbing stairs - gradually increasing activities as tolerated.  You may have sexual intercourse when it is comfortable.  Refrain from any heavy lifting or straining until approved by your doctor.  You may drive when you are no longer taking prescription pain medication, when you can comfortably wear a seatbelt, and when you can safely maneuver your car and apply brakes.  You should see your doctor in the office for a follow-up appointment approximately 2-3 weeks after your surgery.  Make sure that you call for this appointment within a day or two after you arrive home to insure a convenient appointment time.  WHEN TO CALL YOUR DOCTOR: Fever over 101.0 Inability to urinate Continued bleeding from incision Increased pain, redness, or drainage from the incision Increasing abdominal pain  The clinic staff is available to answer your questions during regular business hours.  Please don't hesitate to call and ask to speak to one of the nurses for clinical concerns.  If you have a medical emergency, go to the nearest emergency room or call 911.  A surgeon from [facility] is always on call for the hospital.  Darnell Level, MD [facility] Office: [phone] Toll Free:  [phone] FAX [phone]  Website: [email or web address]

## 2024-05-12 NOTE — Op Note (Signed)
 Procedure Note  Pre-operative Diagnosis:  Biliary hyperkinesia, RUQ abdominal pain  Post-operative Diagnosis:  same  Surgeon:  Krystal Spinner, MD  Assistant:  none   Procedure:  Laparoscopic cholecystectomy with intra-operative cholangiography  Anesthesia:  General  Estimated Blood Loss:  minimal  Drains: none         Specimen: gallbladder to pathology  Indications:  Patient is referred by Dr. Oliva Boots for surgical evaluation and management of right upper quadrant abdominal pain in the setting of biliary hyperkinesia. Patient's primary care physician is Dr. Ryan Hives. Patient has had a 2-year history of intermittent right upper quadrant abdominal pain. Additionally this occurred every 1 to 2 months. Recently the patient has developed symptoms that now occur several times a week. They are related to food intake but not to any particular type of food. Patient describes right upper quadrant abdominal pain which radiates to the back between the scapulae. It is associated with nausea and sweating. Patient has had a prior left inguinal hernia repair but no other abdominal surgery. There is a family history of similar symptoms in the patient's daughter who required cholecystectomy with symptomatic improvement. Patient has undergone ultrasound examination which does not demonstrate gallstones. There was a small gallbladder polyp noted. Patient underwent nuclear medicine hepatobiliary scanning in July 2025. This demonstrated a gallbladder ejection fraction of 93%. Patient is now referred for consideration for cholecystectomy for treatment of persistent right upper quadrant abdominal pain in the setting of biliary hyperkinesia. 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   Procedure description: The patient was seen in the pre-op holding area. The risks, benefits, complications, treatment options, and expected outcomes were previously discussed with the patient. The patient agreed with the proposed plan  and has signed the informed consent form.  The patient was transported to operating room #1 at the [facility]. The patient was placed in the supine position on the operating room table. Following induction of general anesthesia, the abdomen was prepped and draped in the usual aseptic fashion.  An incision was made in the skin near the umbilicus. The midline fascia was incised and the peritoneal cavity was entered and a Hasson cannula was introduced under direct vision. The cannula was secured with a 0-Vicryl pursestring suture. Pneumoperitoneum was established with carbon dioxide. Additional cannulae were introduced under direct vision along the right costal margin in the midline, mid-clavicular line, and anterior axillary line.   The gallbladder was identified and the fundus grasped and retracted cephalad. Adhesions were taken down bluntly and the electrocautery was utilized as needed, taking care not to involve any adjacent structures. The infundibulum was grasped and retracted laterally, exposing the peritoneum overlying the triangle of Calot. The peritoneum was incised and structures exposed with blunt dissection. The cystic duct was clearly identified, bluntly dissected circumferentially, and clipped at the neck of the gallbladder.  An incision was made in the cystic duct and the cholangiogram catheter introduced. The catheter was secured using an ligaclip.  Real-time cholangiography was performed using C-arm fluoroscopy.  There was rapid filling of a normal caliber common bile duct.  There was reflux of contrast into the left and right hepatic ductal systems.  There was free flow distally into the duodenum without filling defect or obstruction.  The catheter was removed from the peritoneal cavity.  The cystic duct was then ligated with ligaclips and divided. The cystic artery was identified, dissected circumferentially, ligated with ligaclips, and divided.  The gallbladder was dissected  away from the gallbladder  bed using the electrocautery for hemostasis. The gallbladder was completely removed from the liver and placed into an endocatch bag. The gallbladder was removed in the endocatch bag through the umbilical port site and submitted to pathology for review.  The right upper quadrant was irrigated and the gallbladder bed was inspected. Hemostasis was achieved with the electrocautery.  Cannulae were removed under direct vision and good hemostasis was noted. Pneumoperitoneum was released and the majority of the carbon dioxide evacuated. The umbilical wound was irrigated and the fascia was then closed with the pursestring suture.  Local anesthetic was infiltrated at all port sites. Skin incisions were closed with 4-0 Monocril subcuticular sutures and Dermabond was applied.  Instrument, sponge, and needle counts were correct at the conclusion of the case.  The patient was awakened from anesthesia and brought to the recovery room in stable condition.  The patient tolerated the procedure well.   Krystal Spinner, MD [facility] Surgery Office: [phone]

## 2024-05-12 NOTE — Anesthesia Preprocedure Evaluation (Addendum)
 Anesthesia Evaluation  Patient identified by MRN, date of birth, ID band Patient awake    Reviewed: Allergy & Precautions, H&P , NPO status , Patient's Chart, lab work & pertinent test results, reviewed documented beta blocker date and time   History of Anesthesia Complications (+) PONV and history of anesthetic complications  Airway Mallampati: I  TM Distance: >3 FB Neck ROM: Full    Dental  (+) Teeth Intact, Dental Advisory Given   Pulmonary neg pulmonary ROS   Pulmonary exam normal breath sounds clear to auscultation       Cardiovascular Exercise Tolerance: Good hypertension, Pt. on medications and Pt. on home beta blockers + CAD  + dysrhythmias Supra Ventricular Tachycardia  Rhythm:Regular Rate:Normal     Neuro/Psych  Headaches PSYCHIATRIC DISORDERS Anxiety      Neuromuscular disease    GI/Hepatic Neg liver ROS,GERD  ,,  Endo/Other  negative endocrine ROS    Renal/GU negative Renal ROS  negative genitourinary   Musculoskeletal   Abdominal   Peds  Hematology negative hematology ROS (+)   Anesthesia Other Findings   Reproductive/Obstetrics negative OB ROS                              Anesthesia Physical Anesthesia Plan  ASA: 3  Anesthesia Plan: General   Post-op Pain Management: Tylenol  PO (pre-op)* and Toradol IV (intra-op)*   Induction: Intravenous  PONV Risk Score and Plan: 4 or greater and Ondansetron , Dexamethasone, Treatment may vary due to age or medical condition, Midazolam , Scopolamine  patch - Pre-op and TIVA  Airway Management Planned: Oral ETT  Additional Equipment:   Intra-op Plan:   Post-operative Plan: Extubation in OR  Informed Consent: I have reviewed the patients History and Physical, chart, labs and discussed the procedure including the risks, benefits and alternatives for the proposed anesthesia with the patient or authorized representative who has  indicated his/her understanding and acceptance.     Dental advisory given  Plan Discussed with: CRNA  Anesthesia Plan Comments:          Anesthesia Quick Evaluation

## 2024-05-13 ENCOUNTER — Encounter (HOSPITAL_COMMUNITY): Payer: Self-pay | Admitting: Surgery

## 2024-05-13 LAB — SURGICAL PATHOLOGY

## 2024-05-13 NOTE — Anesthesia Postprocedure Evaluation (Signed)
 Anesthesia Post Note  Patient: Carl Kelly  Procedure(s) Performed: LAPAROSCOPIC CHOLECYSTECTOMY WITH INTRAOPERATIVE CHOLANGIOGRAM     Patient location during evaluation: PACU Anesthesia Type: General Level of consciousness: awake and alert Pain management: pain level controlled Vital Signs Assessment: post-procedure vital signs reviewed and stable Respiratory status: spontaneous breathing, nonlabored ventilation, respiratory function stable and patient connected to nasal cannula oxygen Cardiovascular status: blood pressure returned to baseline and stable Postop Assessment: no apparent nausea or vomiting Anesthetic complications: no   No notable events documented.  Last Vitals:  Vitals:   05/12/24 1445 05/12/24 1500  BP: 139/70 (!) 147/83  Pulse: 66 60  Resp: 12 14  Temp: (!) 36.3 C   SpO2: 93% 98%    Last Pain:  Vitals:   05/12/24 1500  TempSrc:   PainSc: 0-No pain                 Garnette FORBES Skillern

## 2024-05-19 ENCOUNTER — Telehealth: Admitting: Neurology

## 2024-07-02 ENCOUNTER — Other Ambulatory Visit (HOSPITAL_COMMUNITY): Payer: Self-pay
